# Patient Record
Sex: Male | Born: 1955 | Race: White | Hispanic: No | Marital: Married | Smoking: Never smoker
Health system: Southern US, Community
[De-identification: ages and names within clinical notes are randomized; demographics above are authoritative.]

## PROBLEM LIST (undated history)

## (undated) DIAGNOSIS — M1990 Unspecified osteoarthritis, unspecified site: Secondary | ICD-10-CM

## (undated) DIAGNOSIS — R519 Headache, unspecified: Secondary | ICD-10-CM

## (undated) DIAGNOSIS — R51 Headache: Secondary | ICD-10-CM

## (undated) DIAGNOSIS — Z87442 Personal history of urinary calculi: Secondary | ICD-10-CM

## (undated) DIAGNOSIS — E785 Hyperlipidemia, unspecified: Secondary | ICD-10-CM

## (undated) HISTORY — PX: HERNIA REPAIR: SHX51

## (undated) HISTORY — PX: OTHER SURGICAL HISTORY: SHX169

---

## 2007-11-01 ENCOUNTER — Encounter (INDEPENDENT_AMBULATORY_CARE_PROVIDER_SITE_OTHER): Payer: Self-pay | Admitting: *Deleted

## 2007-11-01 ENCOUNTER — Ambulatory Visit (HOSPITAL_COMMUNITY): Admission: RE | Admit: 2007-11-01 | Discharge: 2007-11-01 | Payer: Self-pay | Admitting: *Deleted

## 2008-07-21 ENCOUNTER — Emergency Department (HOSPITAL_COMMUNITY): Admission: EM | Admit: 2008-07-21 | Discharge: 2008-07-21 | Payer: Self-pay | Admitting: Emergency Medicine

## 2010-04-05 ENCOUNTER — Encounter
Admission: RE | Admit: 2010-04-05 | Discharge: 2010-04-05 | Payer: Self-pay | Source: Home / Self Care | Attending: Internal Medicine | Admitting: Internal Medicine

## 2010-07-17 LAB — COMPREHENSIVE METABOLIC PANEL
ALT: 31 U/L (ref 0–53)
Albumin: 3.8 g/dL (ref 3.5–5.2)
CO2: 28 mEq/L (ref 19–32)
Calcium: 8.8 mg/dL (ref 8.4–10.5)
GFR calc Af Amer: >60 mL/min (ref >60)
Glucose, Bld: 96 mg/dL (ref 70–99)
Potassium: 3.7 mEq/L (ref 3.5–5.1)
Total Bilirubin: 0.8 mg/dL (ref 0.3–1.2)
Total Protein: 6.1 g/dL (ref 6.0–8.3)

## 2010-07-17 LAB — URINALYSIS, ROUTINE W REFLEX MICROSCOPIC
Bilirubin Urine: NEGATIVE
Ketones, ur: 15 mg/dL — AB
Protein, ur: NEGATIVE mg/dL
Specific Gravity, Urine: 1.014 (ref 1.005–1.030)
pH: 6.5 (ref 5.0–8.0)

## 2010-07-17 LAB — DIFFERENTIAL
Basophils Relative: 0 % (ref 0–1)
Eosinophils Absolute: 0.1 10*3/uL (ref 0.0–0.7)
Eosinophils Relative: 1 % (ref 0–5)
Lymphs Abs: 1.2 10*3/uL (ref 0.7–4.0)
Monocytes Absolute: 0.6 10*3/uL (ref 0.1–1.0)
Monocytes Relative: 7 % (ref 3–12)

## 2010-07-17 LAB — CBC
MCHC: 35.1 g/dL (ref 30.0–36.0)
MCV: 95 fL (ref 78.0–100.0)
WBC: 9.1 10*3/uL (ref 4.0–10.5)

## 2010-07-17 LAB — URINE MICROSCOPIC-ADD ON

## 2010-07-17 LAB — LACTIC ACID, PLASMA: Lactic Acid, Venous: 1.5 mmol/L (ref 0.5–2.2)

## 2010-08-20 NOTE — Op Note (Signed)
NAME:  Ronald Daniels, Ronald Daniels NO.:  [id number]   MEDICAL RECORD NO.:  [id number]          PATIENT TYPE:  AMB   LOCATION:  ENDO                         FACILITY:  [facility]   PHYSICIAN:  Georgiana Spinner, M.D.    DATE OF BIRTH:  1956/03/26   DATE OF PROCEDURE:  11/01/2007  DATE OF DISCHARGE:                               OPERATIVE REPORT   PROCEDURE:  Colonoscopy, with polypectomy and biopsy.   INDICATIONS:  Colon polyps, colon cancer screening.   ANESTHESIA:  1. Fentanyl 100 mcg.  2. Versed 8 mg.   PROCEDURE:  With the patient mildly sedated in the left lateral  decubitus position, the Pentax videoscopic colonoscope was inserted in  the rectum after normal rectal exam and passed under direct vision.  With pressure applied, we reached the cecum, identified by the ileocecal  valve and appendiceal orifice, both of which were photographed.  In the  cecum was a linear polyp that was photographed and removed using snare  cautery technique, setting of 20/150 blended current.  The polyp was  suctioned into the endoscope and retrieved through a tissue trap.  From  this point, the colonoscope was slowly withdrawn, taking circumferential  views of the colonic mucosa, stopping in the ascending colon and  subsequently the descending colon, where two small polyps were seen, one  in each section.  Both were removed using hot biopsy forceps technique,  with the same setting of 20/150 blended current.  Both were retrieved  for pathology.  The endoscope was then further withdrawn all the way to  the rectum which appeared normal on direct and showed hemorrhoidal  tissue on retroflexed view.  The endoscope was straightened and  withdrawn.  The patient's vital signs and pulse oximeter remained  stable.  The patient tolerated the procedure well, without apparent  complications.   FINDINGS:  Polyp of cecum and descending colon and descending colon.   PLAN:  Await biopsy reports.  The patient  will call me for results and  follow up with me as an outpatient. Internal hemorrhoids were noted as  well.           ______________________________  Georgiana Spinner, M.D.     GMO/MEDQ  D:  11/01/2007  T:  11/01/2007  Job:  [id number]

## 2010-10-25 ENCOUNTER — Other Ambulatory Visit: Payer: Self-pay | Admitting: Otolaryngology

## 2010-11-09 ENCOUNTER — Ambulatory Visit
Admission: RE | Admit: 2010-11-09 | Discharge: 2010-11-09 | Disposition: A | Discharge status: Home / Self Care | Payer: Self-pay | Source: Ambulatory Visit | Attending: Otolaryngology | Admitting: Otolaryngology

## 2010-11-09 MED ORDER — GADOBENATE DIMEGLUMINE 529 MG/ML IV SOLN
15.0000 mL | Freq: Once | INTRAVENOUS | Status: AC | PRN
  Administered 2010-11-09: 15 mL via INTRAVENOUS

## 2010-11-14 ENCOUNTER — Other Ambulatory Visit (HOSPITAL_COMMUNITY): Payer: Private Health Insurance - Indemnity | Admitting: Radiology

## 2010-12-23 ENCOUNTER — Other Ambulatory Visit (HOSPITAL_COMMUNITY): Payer: Self-pay | Admitting: Otolaryngology

## 2010-12-23 ENCOUNTER — Ambulatory Visit (HOSPITAL_COMMUNITY): Payer: Managed Care, Other (non HMO) | Attending: Otolaryngology | Admitting: Radiology

## 2010-12-23 ENCOUNTER — Other Ambulatory Visit (HOSPITAL_COMMUNITY): Payer: Self-pay | Admitting: Radiology

## 2010-12-23 DIAGNOSIS — Q2111 Secundum atrial septal defect: Secondary | ICD-10-CM | POA: Insufficient documentation

## 2010-12-23 DIAGNOSIS — I379 Nonrheumatic pulmonary valve disorder, unspecified: Secondary | ICD-10-CM | POA: Insufficient documentation

## 2010-12-23 DIAGNOSIS — E785 Hyperlipidemia, unspecified: Secondary | ICD-10-CM | POA: Insufficient documentation

## 2010-12-23 DIAGNOSIS — Q211 Atrial septal defect: Secondary | ICD-10-CM

## 2010-12-23 DIAGNOSIS — Q2112 Patent foramen ovale: Secondary | ICD-10-CM

## 2010-12-23 DIAGNOSIS — I079 Rheumatic tricuspid valve disease, unspecified: Secondary | ICD-10-CM | POA: Insufficient documentation

## 2010-12-24 ENCOUNTER — Encounter (HOSPITAL_COMMUNITY): Payer: Self-pay | Admitting: Otolaryngology

## 2015-07-23 ENCOUNTER — Other Ambulatory Visit: Payer: Self-pay | Admitting: Internal Medicine

## 2015-07-23 DIAGNOSIS — R945 Abnormal results of liver function studies: Secondary | ICD-10-CM

## 2016-04-29 DIAGNOSIS — H5203 Hypermetropia, bilateral: Secondary | ICD-10-CM | POA: Diagnosis not present

## 2016-07-21 DIAGNOSIS — Z125 Encounter for screening for malignant neoplasm of prostate: Secondary | ICD-10-CM | POA: Diagnosis not present

## 2016-07-21 DIAGNOSIS — Z0000 Encounter for general adult medical examination without abnormal findings: Secondary | ICD-10-CM | POA: Diagnosis not present

## 2016-07-28 DIAGNOSIS — M25551 Pain in right hip: Secondary | ICD-10-CM | POA: Diagnosis not present

## 2016-07-28 DIAGNOSIS — Z0000 Encounter for general adult medical examination without abnormal findings: Secondary | ICD-10-CM | POA: Diagnosis not present

## 2016-07-28 DIAGNOSIS — E7800 Pure hypercholesterolemia, unspecified: Secondary | ICD-10-CM | POA: Diagnosis not present

## 2016-08-05 DIAGNOSIS — M150 Primary generalized (osteo)arthritis: Secondary | ICD-10-CM | POA: Diagnosis not present

## 2016-08-05 DIAGNOSIS — M25551 Pain in right hip: Secondary | ICD-10-CM | POA: Diagnosis not present

## 2016-08-05 DIAGNOSIS — M25561 Pain in right knee: Secondary | ICD-10-CM | POA: Diagnosis not present

## 2016-08-05 DIAGNOSIS — M545 Low back pain: Secondary | ICD-10-CM | POA: Diagnosis not present

## 2016-09-03 ENCOUNTER — Ambulatory Visit (INDEPENDENT_AMBULATORY_CARE_PROVIDER_SITE_OTHER): Payer: 59 | Admitting: Orthopaedic Surgery

## 2016-09-03 DIAGNOSIS — M25551 Pain in right hip: Secondary | ICD-10-CM | POA: Diagnosis not present

## 2016-09-03 DIAGNOSIS — M1611 Unilateral primary osteoarthritis, right hip: Secondary | ICD-10-CM | POA: Insufficient documentation

## 2016-09-03 NOTE — Progress Notes (Signed)
Office Visit Note   Patient: Ronald Daniels           Date of Birth: 03/19/1956           MRN: [id number] Visit Date: 09/03/2016              Requested by: No referring provider defined for this encounter. PCP: Ronald Gravel, MD   Assessment & Plan: Visit Diagnoses:  1. Pain of right hip joint   2. Unilateral primary osteoarthritis, right hip     Plan: Given the comminution of his x-ray findings and his clinical exam findings as well as his history a right hip replacement is recommended and warranted. I showed him a hip model and went over his x-rays in detail with him. We had a long and thorough discussion about hip replacement surgery including a detailed discussion of the risk and benefits of the surgery and what is intraoperative and postoperative course will involve. All questions were encouraged and answered. He does wish proceed with hip replacement surgery think this is definitely medically warranted at this point given the severity of his right hip arthritis. We would see him back in 2 weeks postoperative but no x-rays of be needed.  Follow-Up Instructions: Return for 2 weeks post-op.   Orders:  No orders of the defined types were placed in this encounter.  No orders of the defined types were placed in this encounter.     Procedures: No procedures performed   Clinical Data: No additional findings.   Subjective: No chief complaint on file. The patient is very pleasant 61 year old gentleman referred from Dr. Jani Daniels his primary care physician to evaluate severe right hip pain and arthritis. This is been slowly getting worse for about 5 years now. At this point he uses a cane when he walks around. He is tried and failed all forms conservative treatment. His pain is 10 out of 10. It is detrimentally affects his activities daily living, his quality of life, and his mobility. He denies any injury to the hip but the pain is definitely in the groin. He says his motion of the  hip is significantly limited as well.  HPI  Review of Systems He denies any headache, chest pain, shortness of breath, fever, chills, nausea, vomiting.  Objective: Vital Signs: There were no vitals taken for this visit.  Physical Exam He is alert and or a 3 and in no acute distress Ortho Exam He walks with a significant limp. He has almost essentially no internal or external rotation of the right hip and is severely painful. His leg lengths are surprisingly equal. Specialty Comments:  No specialty comments available.  Imaging: No results found. X-rays on canopy system of his pelvis independently reviewed by me show severe end-stage arthritis of his right hip. He actually has moderate arthritis of the left hip. The right hip has complete loss of the joint space. There severe sclerotic changes and significant para-articular osteophytes all around the right hip. His leg lengths do appear equal  PMFS History: Patient Active Problem List   Diagnosis Date Noted  . Unilateral primary osteoarthritis, right hip 09/03/2016  . Pain of right hip joint 09/03/2016   No past medical history on file.  No family history on file.  No past surgical history on file. Social History   Occupational History  . Not on file.   Social History Main Topics  . Smoking status: Not on file  . Smokeless tobacco: Not on file  .  Alcohol use Not on file  . Drug use: Unknown  . Sexual activity: Not on file

## 2016-09-26 ENCOUNTER — Other Ambulatory Visit (INDEPENDENT_AMBULATORY_CARE_PROVIDER_SITE_OTHER): Payer: Self-pay | Admitting: Physician Assistant

## 2016-09-29 ENCOUNTER — Other Ambulatory Visit (INDEPENDENT_AMBULATORY_CARE_PROVIDER_SITE_OTHER): Payer: Self-pay | Admitting: Orthopaedic Surgery

## 2016-10-01 ENCOUNTER — Encounter (HOSPITAL_COMMUNITY)
Admission: RE | Admit: 2016-10-01 | Discharge: 2016-10-01 | Disposition: A | Discharge status: Home / Self Care | Payer: 59 | Source: Ambulatory Visit | Attending: Orthopaedic Surgery | Admitting: Orthopaedic Surgery

## 2016-10-01 ENCOUNTER — Encounter (HOSPITAL_COMMUNITY): Payer: Self-pay

## 2016-10-01 ENCOUNTER — Other Ambulatory Visit (HOSPITAL_COMMUNITY): Payer: Self-pay | Admitting: *Deleted

## 2016-10-01 DIAGNOSIS — Z01812 Encounter for preprocedural laboratory examination: Secondary | ICD-10-CM | POA: Insufficient documentation

## 2016-10-01 DIAGNOSIS — M25551 Pain in right hip: Secondary | ICD-10-CM | POA: Diagnosis not present

## 2016-10-01 DIAGNOSIS — M1611 Unilateral primary osteoarthritis, right hip: Secondary | ICD-10-CM | POA: Diagnosis not present

## 2016-10-01 HISTORY — DX: Unspecified osteoarthritis, unspecified site: M19.90

## 2016-10-01 HISTORY — DX: Hyperlipidemia, unspecified: E78.5

## 2016-10-01 HISTORY — DX: Headache, unspecified: R51.9

## 2016-10-01 HISTORY — DX: Personal history of urinary calculi: Z87.442

## 2016-10-01 HISTORY — DX: Headache: R51

## 2016-10-01 LAB — CBC
HCT: 47.2 % (ref 39.0–52.0)
HEMOGLOBIN: 16.5 g/dL (ref 13.0–17.0)
MCH: 31.9 pg (ref 26.0–34.0)
MCHC: 35 g/dL (ref 30.0–36.0)
MCV: 91.1 fL (ref 78.0–100.0)
Platelets: 180 10*3/uL (ref 150–400)
RBC: 5.18 MIL/uL (ref 4.22–5.81)
RDW: 12.7 % (ref 11.5–15.5)
WBC: 6.6 10*3/uL (ref 4.0–10.5)

## 2016-10-01 LAB — SURGICAL PCR SCREEN
MRSA, PCR: NEGATIVE
Staphylococcus aureus: POSITIVE — AB

## 2016-10-01 NOTE — Pre-Procedure Instructions (Signed)
Ronald Daniels  10/01/2016      [facility], [place] [address] Phone: [phone] Fax: [phone]    Your procedure is scheduled on October 07, 2016 Tuesday.   Report to [facility] Admitting at 01:15 P.M.   Call this number if you have problems the morning of surgery:  [phone]   Remember:  Do not eat food or drink liquids after midnight.  Take these medicines the morning of surgery with A SIP OF WATER: Flexeril (as needed), Gabapentin, and Afrin (as needed).  STOP ASPIRIN,ANTIINFLAMATORIES (IBUPROFEN,ALEVE,MOTRIN,ADVIL,GOODY'S POWDERS),HERBAL SUPPLEMENTS,FISH OIL,AND VITAMINS 5-7 DAYS PRIOR TO SURGERY    Do not wear jewelry, make-up or nail polish.  Do not wear lotions, powders, or perfumes, or deoderant.  Do not shave 48 hours prior to surgery.  Men may shave face and neck.  Do not bring valuables to the hospital.  [facility] is not responsible for any belongings or valuables.  Contacts, dentures or bridgework may not be worn into surgery.  Leave your suitcase in the car.  After surgery it may be brought to your room.  For patients admitted to the hospital, discharge time will be determined by your treatment team.  Patients discharged the day of surgery will not be allowed to drive home.   Special Instructions: [place] - Preparing for Surgery  Before surgery, you can play an important role.  Because skin is not sterile, your skin needs to be as free of germs as possible.  You can reduce the number of germs on you skin by washing with CHG (chlorahexidine gluconate) soap before surgery.  CHG is an antiseptic cleaner which kills germs and bonds with the skin to continue killing germs even after washing.  Please DO NOT use if you have an allergy to CHG or antibacterial soaps.  If your skin becomes reddened/irritated stop using the CHG and inform your nurse when you  arrive at Short Stay.  Do not shave (including legs and underarms) for at least 48 hours prior to the first CHG shower.  You may shave your face.  Please follow these instructions carefully:   1.  Shower with CHG Soap the night before surgery and the   morning of Surgery.  2.  If you choose to wash your hair, wash your hair first as usual with your normal shampoo.  3.  After you shampoo, rinse your hair and body thoroughly to remove the  Shampoo.  4.  Use CHG as you would any other liquid soap.  You can apply chg directly  to the skin and wash gently with scrungie or a clean washcloth.  5.  Apply the CHG Soap to your body ONLY FROM THE NECK DOWN.   Do not use on open wounds or open sores.  Avoid contact with your eyes,  ears, mouth and genitals (private parts).  Wash genitals (private parts) with your normal soap.  6.  Wash thoroughly, paying special attention to the area where your surgery will be performed.  7.  Thoroughly rinse your body with warm water from the neck down.  8.  DO NOT shower/wash with your normal soap after using and rinsing o  the CHG Soap.  9.  Pat yourself dry with a clean towel.            10.  Wear clean pajamas.  11.  Place clean sheets on your bed the night of your first shower and do not sleep with pets.  Day of Surgery  Do not apply any lotions/deodorants the morning of surgery.  Please wear clean clothes to the hospital/surgery center.   Please read over the following fact sheets that you were given. MRSA Information and Surgical Site Infection Prevention

## 2016-10-01 NOTE — Progress Notes (Signed)
Mupirocin Ointment called into [facility] on [address] for positive PCR of Staph. Left message on pt's voicemail informing him of results and need to pick up Rx and to start using it tonight.

## 2016-10-01 NOTE — Progress Notes (Signed)
Denies seeing a cardiologist  No recent chest pain or discorfort

## 2016-10-06 MED ORDER — SODIUM CHLORIDE 0.9 % IV SOLN
1000.0000 mg | INTRAVENOUS | Status: AC
  Administered 2016-10-07: 1000 mg via INTRAVENOUS
  Filled 2016-10-06: qty 1100

## 2016-10-06 MED ORDER — CEFAZOLIN SODIUM-DEXTROSE 2-4 GM/100ML-% IV SOLN
2.0000 g | INTRAVENOUS | Status: AC
  Administered 2016-10-07: 2 g via INTRAVENOUS
  Filled 2016-10-06: qty 100

## 2016-10-07 ENCOUNTER — Encounter (HOSPITAL_COMMUNITY): Payer: Self-pay

## 2016-10-07 ENCOUNTER — Inpatient Hospital Stay (HOSPITAL_COMMUNITY): Payer: 59

## 2016-10-07 ENCOUNTER — Encounter (HOSPITAL_COMMUNITY)
Admission: RE | Disposition: A | Discharge status: Home / Self Care | Payer: Self-pay | Source: Ambulatory Visit | Attending: Orthopaedic Surgery

## 2016-10-07 ENCOUNTER — Inpatient Hospital Stay (HOSPITAL_COMMUNITY): Payer: 59 | Admitting: Certified Registered Nurse Anesthetist

## 2016-10-07 ENCOUNTER — Inpatient Hospital Stay (HOSPITAL_COMMUNITY)
Admission: RE | Admit: 2016-10-07 | Discharge: 2016-10-09 | DRG: 470 | Disposition: A | Discharge status: Home / Self Care | Payer: 59 | Source: Ambulatory Visit | Attending: Orthopaedic Surgery | Admitting: Orthopaedic Surgery

## 2016-10-07 DIAGNOSIS — E785 Hyperlipidemia, unspecified: Secondary | ICD-10-CM | POA: Diagnosis present

## 2016-10-07 DIAGNOSIS — Z87442 Personal history of urinary calculi: Secondary | ICD-10-CM

## 2016-10-07 DIAGNOSIS — Z79899 Other long term (current) drug therapy: Secondary | ICD-10-CM

## 2016-10-07 DIAGNOSIS — M1611 Unilateral primary osteoarthritis, right hip: Secondary | ICD-10-CM | POA: Diagnosis not present

## 2016-10-07 DIAGNOSIS — Z96641 Presence of right artificial hip joint: Secondary | ICD-10-CM

## 2016-10-07 DIAGNOSIS — Z419 Encounter for procedure for purposes other than remedying health state, unspecified: Secondary | ICD-10-CM

## 2016-10-07 DIAGNOSIS — Z471 Aftercare following joint replacement surgery: Secondary | ICD-10-CM | POA: Diagnosis not present

## 2016-10-07 DIAGNOSIS — R269 Unspecified abnormalities of gait and mobility: Secondary | ICD-10-CM | POA: Diagnosis not present

## 2016-10-07 HISTORY — PX: TOTAL HIP ARTHROPLASTY: SHX124

## 2016-10-07 SURGERY — ARTHROPLASTY, HIP, TOTAL, ANTERIOR APPROACH
Anesthesia: General | Site: Hip | Laterality: Right

## 2016-10-07 MED ORDER — METOCLOPRAMIDE HCL 5 MG PO TABS
5.0000 mg | ORAL_TABLET | Freq: Three times a day (TID) | ORAL | Status: DC | PRN
Start: 2016-10-07 — End: 2016-10-09

## 2016-10-07 MED ORDER — PSYLLIUM 95 % PO PACK
1.0000 | PACK | Freq: Every day | ORAL | Status: DC
  Administered 2016-10-08: 1 via ORAL
  Filled 2016-10-07 (×2): qty 1

## 2016-10-07 MED ORDER — ACETAMINOPHEN 500 MG PO TABS
ORAL_TABLET | ORAL | Status: AC
  Administered 2016-10-07: 1000 mg via ORAL
  Filled 2016-10-07: qty 2

## 2016-10-07 MED ORDER — ROCURONIUM BROMIDE 100 MG/10ML IV SOLN
INTRAVENOUS | Status: DC | PRN
  Administered 2016-10-07: 50 mg via INTRAVENOUS

## 2016-10-07 MED ORDER — LACTATED RINGERS IV SOLN
INTRAVENOUS | Status: DC | PRN
  Administered 2016-10-07 (×2): via INTRAVENOUS

## 2016-10-07 MED ORDER — DEXAMETHASONE SODIUM PHOSPHATE 10 MG/ML IJ SOLN
INTRAMUSCULAR | Status: AC
  Filled 2016-10-07: qty 1

## 2016-10-07 MED ORDER — ACETAMINOPHEN 500 MG PO TABS
1000.0000 mg | ORAL_TABLET | Freq: Once | ORAL | Status: AC
  Administered 2016-10-07: 1000 mg via ORAL
  Filled 2016-10-07: qty 2

## 2016-10-07 MED ORDER — DIPHENHYDRAMINE HCL 12.5 MG/5ML PO ELIX: 12.5000 mg | ORAL_SOLUTION | ORAL | Status: DC | PRN

## 2016-10-07 MED ORDER — KETOROLAC TROMETHAMINE 15 MG/ML IJ SOLN
7.5000 mg | Freq: Four times a day (QID) | INTRAMUSCULAR | Status: AC
  Administered 2016-10-07 – 2016-10-08 (×4): 7.5 mg via INTRAVENOUS
  Filled 2016-10-07 (×5): qty 1

## 2016-10-07 MED ORDER — FENTANYL CITRATE (PF) 100 MCG/2ML IJ SOLN
INTRAMUSCULAR | Status: DC | PRN
  Administered 2016-10-07: 100 ug via INTRAVENOUS
  Administered 2016-10-07 (×3): 50 ug via INTRAVENOUS

## 2016-10-07 MED ORDER — ACETAMINOPHEN 650 MG RE SUPP: 650.0000 mg | Freq: Four times a day (QID) | RECTAL | Status: DC | PRN

## 2016-10-07 MED ORDER — SODIUM CHLORIDE 0.9 % IV SOLN: INTRAVENOUS | Status: DC

## 2016-10-07 MED ORDER — MIDAZOLAM HCL 5 MG/5ML IJ SOLN
INTRAMUSCULAR | Status: DC | PRN
  Administered 2016-10-07: 2 mg via INTRAVENOUS

## 2016-10-07 MED ORDER — MENTHOL 3 MG MT LOZG: 1.0000 | LOZENGE | OROMUCOSAL | Status: DC | PRN

## 2016-10-07 MED ORDER — METHOCARBAMOL 1000 MG/10ML IJ SOLN
500.0000 mg | Freq: Four times a day (QID) | INTRAVENOUS | Status: DC | PRN
  Filled 2016-10-07: qty 5

## 2016-10-07 MED ORDER — ONDANSETRON HCL 4 MG/2ML IJ SOLN
INTRAMUSCULAR | Status: DC | PRN
  Administered 2016-10-07: 4 mg via INTRAVENOUS

## 2016-10-07 MED ORDER — POLYETHYLENE GLYCOL 3350 17 G PO PACK
17.0000 g | PACK | Freq: Every day | ORAL | Status: DC | PRN
  Filled 2016-10-07: qty 1

## 2016-10-07 MED ORDER — GABAPENTIN 300 MG PO CAPS
300.0000 mg | ORAL_CAPSULE | Freq: Two times a day (BID) | ORAL | Status: DC
  Administered 2016-10-07 – 2016-10-09 (×4): 300 mg via ORAL
  Filled 2016-10-07 (×4): qty 1

## 2016-10-07 MED ORDER — FENTANYL CITRATE (PF) 250 MCG/5ML IJ SOLN
INTRAMUSCULAR | Status: AC
  Filled 2016-10-07: qty 5

## 2016-10-07 MED ORDER — OXYCODONE HCL 5 MG PO TABS
5.0000 mg | ORAL_TABLET | ORAL | Status: DC | PRN
  Administered 2016-10-07 – 2016-10-09 (×8): 10 mg via ORAL
  Filled 2016-10-07 (×8): qty 2

## 2016-10-07 MED ORDER — SIMVASTATIN 20 MG PO TABS
20.0000 mg | ORAL_TABLET | Freq: Every day | ORAL | Status: DC
  Administered 2016-10-07 – 2016-10-08 (×2): 20 mg via ORAL
  Filled 2016-10-07 (×2): qty 1

## 2016-10-07 MED ORDER — ACETAMINOPHEN 325 MG PO TABS
650.0000 mg | ORAL_TABLET | Freq: Four times a day (QID) | ORAL | Status: DC | PRN
  Administered 2016-10-07: 650 mg via ORAL
  Filled 2016-10-07: qty 2

## 2016-10-07 MED ORDER — ALUM & MAG HYDROXIDE-SIMETH 200-200-20 MG/5ML PO SUSP: 30.0000 mL | ORAL | Status: DC | PRN

## 2016-10-07 MED ORDER — SUGAMMADEX SODIUM 200 MG/2ML IV SOLN
INTRAVENOUS | Status: DC | PRN
  Administered 2016-10-07: 200 mg via INTRAVENOUS

## 2016-10-07 MED ORDER — ASPIRIN 81 MG PO CHEW
81.0000 mg | CHEWABLE_TABLET | Freq: Two times a day (BID) | ORAL | Status: DC
  Administered 2016-10-07 – 2016-10-09 (×4): 81 mg via ORAL
  Filled 2016-10-07 (×4): qty 1

## 2016-10-07 MED ORDER — HYDROMORPHONE HCL 1 MG/ML IJ SOLN
INTRAMUSCULAR | Status: AC
Start: 2016-10-07 — End: 2016-10-07
  Filled 2016-10-07: qty 0.5

## 2016-10-07 MED ORDER — HYDROMORPHONE HCL 1 MG/ML IJ SOLN
1.0000 mg | INTRAMUSCULAR | Status: DC | PRN
  Administered 2016-10-07: 1 mg via INTRAVENOUS
  Filled 2016-10-07: qty 1

## 2016-10-07 MED ORDER — ZOLPIDEM TARTRATE 5 MG PO TABS
5.0000 mg | ORAL_TABLET | Freq: Every evening | ORAL | Status: DC | PRN
  Administered 2016-10-07 – 2016-10-08 (×2): 5 mg via ORAL
  Filled 2016-10-07 (×2): qty 1

## 2016-10-07 MED ORDER — CEFAZOLIN SODIUM-DEXTROSE 1-4 GM/50ML-% IV SOLN
1.0000 g | Freq: Four times a day (QID) | INTRAVENOUS | Status: AC
  Administered 2016-10-07 – 2016-10-08 (×2): 1 g via INTRAVENOUS
  Filled 2016-10-07 (×3): qty 50

## 2016-10-07 MED ORDER — OXYCODONE HCL 5 MG PO TABS: 5.0000 mg | ORAL_TABLET | Freq: Once | ORAL | Status: DC | PRN

## 2016-10-07 MED ORDER — DOCUSATE SODIUM 100 MG PO CAPS
100.0000 mg | ORAL_CAPSULE | Freq: Two times a day (BID) | ORAL | Status: DC
  Administered 2016-10-07 – 2016-10-09 (×4): 100 mg via ORAL
  Filled 2016-10-07 (×4): qty 1

## 2016-10-07 MED ORDER — ONDANSETRON HCL 4 MG PO TABS: 4.0000 mg | ORAL_TABLET | Freq: Four times a day (QID) | ORAL | Status: DC | PRN

## 2016-10-07 MED ORDER — LIDOCAINE HCL (CARDIAC) 20 MG/ML IV SOLN
INTRAVENOUS | Status: DC | PRN
  Administered 2016-10-07: 60 mg via INTRAVENOUS

## 2016-10-07 MED ORDER — 0.9 % SODIUM CHLORIDE (POUR BTL) OPTIME
TOPICAL | Status: DC | PRN
  Administered 2016-10-07: 1000 mL

## 2016-10-07 MED ORDER — MIDAZOLAM HCL 2 MG/2ML IJ SOLN
INTRAMUSCULAR | Status: AC
Start: 2016-10-07 — End: ?
  Filled 2016-10-07: qty 2

## 2016-10-07 MED ORDER — ONDANSETRON HCL 4 MG/2ML IJ SOLN: 4.0000 mg | Freq: Four times a day (QID) | INTRAMUSCULAR | Status: DC | PRN

## 2016-10-07 MED ORDER — HYDROMORPHONE HCL 1 MG/ML IJ SOLN
INTRAMUSCULAR | Status: AC
  Administered 2016-10-07: 0.5 mg via INTRAVENOUS
  Filled 2016-10-07: qty 0.5

## 2016-10-07 MED ORDER — CHLORHEXIDINE GLUCONATE 4 % EX LIQD: 60.0000 mL | Freq: Once | CUTANEOUS | Status: DC

## 2016-10-07 MED ORDER — PHENOL 1.4 % MT LIQD: 1.0000 | OROMUCOSAL | Status: DC | PRN

## 2016-10-07 MED ORDER — SUGAMMADEX SODIUM 200 MG/2ML IV SOLN
INTRAVENOUS | Status: AC
  Filled 2016-10-07: qty 2

## 2016-10-07 MED ORDER — METHOCARBAMOL 500 MG PO TABS
500.0000 mg | ORAL_TABLET | Freq: Four times a day (QID) | ORAL | Status: DC | PRN
  Administered 2016-10-07 – 2016-10-09 (×3): 500 mg via ORAL
  Filled 2016-10-07 (×4): qty 1

## 2016-10-07 MED ORDER — HYDROMORPHONE HCL 1 MG/ML IJ SOLN
0.2500 mg | INTRAMUSCULAR | Status: DC | PRN
Start: 2016-10-07 — End: 2016-10-07
  Administered 2016-10-07 (×2): 0.5 mg via INTRAVENOUS

## 2016-10-07 MED ORDER — PROPOFOL 10 MG/ML IV BOLUS
INTRAVENOUS | Status: DC | PRN
  Administered 2016-10-07: 170 mg via INTRAVENOUS

## 2016-10-07 MED ORDER — SODIUM CHLORIDE 0.9 % IR SOLN
Status: DC | PRN
  Administered 2016-10-07: 1000 mL

## 2016-10-07 MED ORDER — METOCLOPRAMIDE HCL 5 MG/ML IJ SOLN: 5.0000 mg | Freq: Three times a day (TID) | INTRAMUSCULAR | Status: DC | PRN

## 2016-10-07 MED ORDER — OXYCODONE HCL 5 MG/5ML PO SOLN: 5.0000 mg | Freq: Once | ORAL | Status: DC | PRN

## 2016-10-07 SURGICAL SUPPLY — 49 items
BENZOIN TINCTURE PRP APPL 2/3 (GAUZE/BANDAGES/DRESSINGS) ×2 IMPLANT
BLADE CLIPPER SURG (BLADE) IMPLANT
BLADE SAW SGTL 18X1.27X75 (BLADE) ×2 IMPLANT
CAPT HIP TOTAL 2 ×2 IMPLANT
CELLS DAT CNTRL 66122 CELL SVR (MISCELLANEOUS) ×1 IMPLANT
COVER SURGICAL LIGHT HANDLE (MISCELLANEOUS) ×2 IMPLANT
DRAPE C-ARM 42X72 X-RAY (DRAPES) ×2 IMPLANT
DRAPE STERI IOBAN 125X83 (DRAPES) ×2 IMPLANT
DRAPE U-SHAPE 47X51 STRL (DRAPES) ×6 IMPLANT
DRSG AQUACEL AG ADV 3.5X10 (GAUZE/BANDAGES/DRESSINGS) ×2 IMPLANT
DURAPREP 26ML APPLICATOR (WOUND CARE) ×2 IMPLANT
ELECT BLADE 4.0 EZ CLEAN MEGAD (MISCELLANEOUS) ×2
ELECT BLADE 6.5 EXT (BLADE) IMPLANT
ELECT REM PT RETURN 9FT ADLT (ELECTROSURGICAL) ×2
ELECTRODE BLDE 4.0 EZ CLN MEGD (MISCELLANEOUS) ×1 IMPLANT
ELECTRODE REM PT RTRN 9FT ADLT (ELECTROSURGICAL) ×1 IMPLANT
FACESHIELD WRAPAROUND (MASK) ×4 IMPLANT
GLOVE BIOGEL PI IND STRL 8 (GLOVE) ×2 IMPLANT
GLOVE BIOGEL PI INDICATOR 8 (GLOVE) ×2
GLOVE ECLIPSE 8.0 STRL XLNG CF (GLOVE) ×2 IMPLANT
GLOVE ORTHO TXT STRL SZ7.5 (GLOVE) ×4 IMPLANT
GOWN STRL REUS W/ TWL LRG LVL3 (GOWN DISPOSABLE) ×2 IMPLANT
GOWN STRL REUS W/ TWL XL LVL3 (GOWN DISPOSABLE) ×2 IMPLANT
GOWN STRL REUS W/TWL LRG LVL3 (GOWN DISPOSABLE) ×2
GOWN STRL REUS W/TWL XL LVL3 (GOWN DISPOSABLE) ×2
HANDPIECE INTERPULSE COAX TIP (DISPOSABLE) ×1
KIT BASIN OR (CUSTOM PROCEDURE TRAY) ×2 IMPLANT
KIT ROOM TURNOVER OR (KITS) ×2 IMPLANT
MANIFOLD NEPTUNE II (INSTRUMENTS) ×2 IMPLANT
NS IRRIG 1000ML POUR BTL (IV SOLUTION) ×2 IMPLANT
PACK TOTAL JOINT (CUSTOM PROCEDURE TRAY) ×2 IMPLANT
PAD ARMBOARD 7.5X6 YLW CONV (MISCELLANEOUS) ×2 IMPLANT
RTRCTR WOUND ALEXIS 18CM MED (MISCELLANEOUS) ×2
SET HNDPC FAN SPRY TIP SCT (DISPOSABLE) ×1 IMPLANT
STAPLER VISISTAT 35W (STAPLE) IMPLANT
STRIP CLOSURE SKIN 1/2X4 (GAUZE/BANDAGES/DRESSINGS) ×2 IMPLANT
SUT ETHIBOND NAB CT1 #1 30IN (SUTURE) ×2 IMPLANT
SUT MNCRL AB 4-0 PS2 18 (SUTURE) ×2 IMPLANT
SUT VIC AB 0 CT1 27 (SUTURE) ×2
SUT VIC AB 0 CT1 27XBRD ANBCTR (SUTURE) ×2 IMPLANT
SUT VIC AB 1 CT1 27 (SUTURE) ×2
SUT VIC AB 1 CT1 27XBRD ANBCTR (SUTURE) ×2 IMPLANT
SUT VIC AB 2-0 CT1 27 (SUTURE) ×1
SUT VIC AB 2-0 CT1 TAPERPNT 27 (SUTURE) ×1 IMPLANT
TOWEL OR 17X24 6PK STRL BLUE (TOWEL DISPOSABLE) ×2 IMPLANT
TOWEL OR 17X26 10 PK STRL BLUE (TOWEL DISPOSABLE) ×2 IMPLANT
TRAY CATH 16FR W/PLASTIC CATH (SET/KITS/TRAYS/PACK) IMPLANT
TRAY FOLEY W/METER SILVER 16FR (SET/KITS/TRAYS/PACK) IMPLANT
WATER STERILE IRR 1000ML POUR (IV SOLUTION) ×4 IMPLANT

## 2016-10-07 NOTE — H&P (Signed)
TOTAL HIP ADMISSION H&P  Patient is admitted for right total hip arthroplasty.  Subjective:  Chief Complaint: right hip pain  HPI: Ronald Daniels, 61 y.o. male, has a history of pain and functional disability in the right hip(s) due to arthritis and patient has failed non-surgical conservative treatments for greater than 12 weeks to include NSAID's and/or analgesics, corticosteriod injections, use of assistive devices, weight reduction as appropriate and activity modification.  Onset of symptoms was gradual starting 5 years ago with gradually worsening course since that time.The patient noted no past surgery on the right hip(s).  Patient currently rates pain in the right hip at 10 out of 10 with activity. Patient has night pain, worsening of pain with activity and weight bearing, pain that interfers with activities of daily living and pain with passive range of motion. Patient has evidence of subchondral sclerosis, periarticular osteophytes and joint space narrowing by imaging studies. This condition presents safety issues increasing the risk of falls.  There is no current active infection.  Patient Active Problem List   Diagnosis Date Noted  . Unilateral primary osteoarthritis, right hip 09/03/2016  . Pain of right hip joint 09/03/2016   Past Medical History:  Diagnosis Date  . Arthritis   . Headache    Sinus headache  . History of kidney stones   . Hyperlipidemia     Past Surgical History:  Procedure Laterality Date  . HERNIA REPAIR    . torn cartialge Left     Prescriptions Prior to Admission  Medication Sig Dispense Refill Last Dose  . cyclobenzaprine (FLEXERIL) 10 MG tablet Take 10 mg by mouth 2 (two) times daily as needed. For back pain/spasms.  0   . Dextromethorphan-Guaifenesin 10-200 MG CAPS Take 1-2 tablets by mouth 3 (three) times daily as needed (for sinus/cold or flu).     . fexofenadine (ALLEGRA) 180 MG tablet Take 180 mg by mouth at bedtime.     . gabapentin  (NEURONTIN) 300 MG capsule Take 300 mg by mouth 2 (two) times daily.  0   . ibuprofen (ADVIL,MOTRIN) 200 MG tablet Take 800 mg by mouth every 8 (eight) hours as needed (for pain (back & legs)).     Marland Kitchen oxymetazoline (AFRIN) 0.05 % nasal spray Place 1 spray into both nostrils 3 (three) times daily as needed for congestion.     . Probiotic Product (PROBIOTIC PO) Take 2 capsules by mouth at bedtime.     . psyllium (REGULOID) 0.52 g capsule Take 2.08 g by mouth at bedtime.     . simvastatin (ZOCOR) 20 MG tablet Take 20 mg by mouth at bedtime.  0    Allergies  Allergen Reactions  . No Known Allergies     Social History  Substance Use Topics  . Smoking status: Never Smoker  . Smokeless tobacco: Never Used  . Alcohol use Yes     Comment: rarely    No family history on file.   Review of Systems  Musculoskeletal: Positive for joint pain.  All other systems reviewed and are negative.   Objective:  Physical Exam  Constitutional: He is oriented to person, place, and time. He appears well-developed and well-nourished.  HENT:  Head: Normocephalic and atraumatic.  Eyes: EOM are normal. Pupils are equal, round, and reactive to light.  Neck: Normal range of motion. Neck supple.  Cardiovascular: Normal rate and regular rhythm.   Respiratory: Effort normal and breath sounds normal.  GI: Soft. Bowel sounds are normal.  Musculoskeletal:  Right hip: He exhibits decreased range of motion, decreased strength, tenderness and bony tenderness.  Neurological: He is alert and oriented to person, place, and time.  Skin: Skin is warm and dry.  Psychiatric: He has a normal mood and affect.    Vital signs in last 24 hours: Temp:  [97.6 F (36.4 C)] 97.6 F (36.4 C) (07/03 1326) Pulse Rate:  [73] 73 (07/03 1326) Resp:  [20] 20 (07/03 1326) BP: (156)/(102) 156/102 (07/03 1326) SpO2:  [97 %] 97 % (07/03 1326) Weight:  [205 lb (93 kg)] 205 lb (93 kg) (07/03 1326)  Labs:   Estimated body mass  index is 29.41 kg/m as calculated from the following:   Height as of 10/01/16: 5\' 10"  (1.778 m).   Weight as of this encounter: 205 lb (93 kg).   Imaging Review Plain radiographs demonstrate severe degenerative joint disease of the right hip(s). The bone quality appears to be excellent for age and reported activity level.  Assessment/Plan:  End stage arthritis, right hip(s)  The patient history, physical examination, clinical judgement of the provider and imaging studies are consistent with end stage degenerative joint disease of the right hip(s) and total hip arthroplasty is deemed medically necessary. The treatment options including medical management, injection therapy, arthroscopy and arthroplasty were discussed at length. The risks and benefits of total hip arthroplasty were presented and reviewed. The risks due to aseptic loosening, infection, stiffness, dislocation/subluxation,  thromboembolic complications and other imponderables were discussed.  The patient acknowledged the explanation, agreed to proceed with the plan and consent was signed. Patient is being admitted for inpatient treatment for surgery, pain control, PT, OT, prophylactic antibiotics, VTE prophylaxis, progressive ambulation and ADL's and discharge planning.The patient is planning to be discharged home with home health services

## 2016-10-07 NOTE — Brief Op Note (Signed)
10/07/2016  5:27 PM  PATIENT:  Ronald Daniels  61 y.o. male  PRE-OPERATIVE DIAGNOSIS:  severe osteoarthritis right hip  POST-OPERATIVE DIAGNOSIS:  severe osteoarthritis right hip  PROCEDURE:  Procedure(s): RIGHT TOTAL HIP ARTHROPLASTY ANTERIOR APPROACH (Right)  SURGEON:  Surgeon(s) and Role:    Mcarthur Rossetti, MD - Primary  PHYSICIAN ASSISTANT: Benita Stabile, PA-C  ANESTHESIA:   general  EBL:  Total I/O In: 1000 [I.V.:1000] Out: 250 [Blood:250]  COUNTS:  YES  DICTATION: .Other Dictation: Dictation Number [id number]  PLAN OF CARE: Admit to inpatient   PATIENT DISPOSITION:  PACU - hemodynamically stable.   Delay start of Pharmacological VTE agent (>24hrs) due to surgical blood loss or risk of bleeding: no

## 2016-10-07 NOTE — Anesthesia Preprocedure Evaluation (Signed)
Anesthesia Evaluation  Patient identified by MRN, date of birth, ID band Patient awake    Reviewed: Allergy & Precautions, NPO status , Patient's Chart, lab work & pertinent test results  Airway Mallampati: II  TM Distance: >3 FB Neck ROM: Full    Dental  (+) Teeth Intact   Pulmonary neg pulmonary ROS,    breath sounds clear to auscultation       Cardiovascular negative cardio ROS   Rhythm:Regular     Neuro/Psych  Headaches, negative psych ROS   GI/Hepatic negative GI ROS, Neg liver ROS,   Endo/Other  negative endocrine ROS  Renal/GU negative Renal ROS     Musculoskeletal  (+) Arthritis ,   Abdominal   Peds  Hematology negative hematology ROS (+)   Anesthesia Other Findings   Reproductive/Obstetrics negative OB ROS                             Anesthesia Physical Anesthesia Plan  ASA: II  Anesthesia Plan: General   Post-op Pain Management:    Induction: Intravenous  PONV Risk Score and Plan: 2 and Ondansetron and Dexamethasone  Airway Management Planned: Oral ETT  Additional Equipment: None  Intra-op Plan:   Post-operative Plan: Extubation in OR  Informed Consent: I have reviewed the patients History and Physical, chart, labs and discussed the procedure including the risks, benefits and alternatives for the proposed anesthesia with the patient or authorized representative who has indicated his/her understanding and acceptance.   Dental advisory given  Plan Discussed with: CRNA and Surgeon  Anesthesia Plan Comments: (Refused spinal anesthesia)        Anesthesia Quick Evaluation

## 2016-10-07 NOTE — Progress Notes (Signed)
Pt arrived from PACU to room 5N21. Pt c/o pain to right hip. Right hip dressing in place, clean, dry and intact. Pain medication to be administered.

## 2016-10-07 NOTE — Transfer of Care (Signed)
Immediate Anesthesia Transfer of Care Note  Patient: Ronald Daniels  Procedure(s) Performed: Procedure(s): RIGHT TOTAL HIP ARTHROPLASTY ANTERIOR APPROACH (Right)  Patient Location: PACU  Anesthesia Type:General  Level of Consciousness: awake, alert , oriented and patient cooperative  Airway & Oxygen Therapy: Patient Spontanous Breathing and Patient connected to nasal cannula oxygen  Post-op Assessment: Report given to RN and Post -op Vital signs reviewed and stable  Post vital signs: Reviewed and stable  Last Vitals:  Vitals:   10/07/16 1326 10/07/16 1740  BP: (!) 156/102   Pulse: 73   Resp: 20   Temp: 36.4 C (P) 36.4 C    Last Pain:  Vitals:   10/07/16 1740  PainSc: (P) 5       Patients Stated Pain Goal: 1 (41/36/43 8377)  Complications: No apparent anesthesia complications

## 2016-10-07 NOTE — Anesthesia Procedure Notes (Signed)
Procedure Name: Intubation Date/Time: 10/07/2016 3:43 PM Performed by: Myna Bright Pre-anesthesia Checklist: Patient identified, Emergency Drugs available, Suction available and Patient being monitored Patient Re-evaluated:Patient Re-evaluated prior to inductionOxygen Delivery Method: Circle system utilized Preoxygenation: Pre-oxygenation with 100% oxygen Intubation Type: IV induction Ventilation: Mask ventilation without difficulty Laryngoscope Size: Mac and 4 Grade View: Grade I Tube type: Oral Tube size: 7.5 mm Number of attempts: 1 Airway Equipment and Method: Stylet Placement Confirmation: ETT inserted through vocal cords under direct vision,  positive ETCO2 and breath sounds checked- equal and bilateral Secured at: 22 cm Tube secured with: Tape Dental Injury: Teeth and Oropharynx as per pre-operative assessment

## 2016-10-08 LAB — BASIC METABOLIC PANEL
Anion gap: 8 (ref 5–15)
BUN: 17 mg/dL (ref 6–20)
CHLORIDE: 104 mmol/L (ref 101–111)
CO2: 25 mmol/L (ref 22–32)
Calcium: 8.7 mg/dL — ABNORMAL LOW (ref 8.9–10.3)
Creatinine, Ser: 1.1 mg/dL (ref 0.61–1.24)
GFR calc Af Amer: >60 mL/min (ref >60)
GFR calc non Af Amer: >60 mL/min (ref >60)
GLUCOSE: 172 mg/dL — AB (ref 65–99)
POTASSIUM: 4.3 mmol/L (ref 3.5–5.1)
Sodium: 137 mmol/L (ref 135–145)

## 2016-10-08 LAB — CBC
HEMATOCRIT: 42.5 % (ref 39.0–52.0)
HEMOGLOBIN: 14.3 g/dL (ref 13.0–17.0)
MCH: 30.9 pg (ref 26.0–34.0)
MCHC: 33.6 g/dL (ref 30.0–36.0)
MCV: 91.8 fL (ref 78.0–100.0)
Platelets: 170 10*3/uL (ref 150–400)
RBC: 4.63 MIL/uL (ref 4.22–5.81)
RDW: 12.8 % (ref 11.5–15.5)
WBC: 9.9 10*3/uL (ref 4.0–10.5)

## 2016-10-08 NOTE — Evaluation (Signed)
Occupational Therapy Evaluation and Discharge Patient Details Name: Ronald Daniels MRN: [id number] DOB: 09-Sep-1955 Today's Date: 10/08/2016    History of Present Illness Patient is a 61 y/o male s/p elective R THA on 09/07/16. No pertinent past medical history.    Clinical Impression   PTA Pt independent in ADL and mobility. Pt presented very pleasant and willing to work with OT. Pt's wife present throughout session. Pt able to demonstrate LB dressing, grooming and simulated toilet transfer this session with no physical assist needed. Pt and wife educated on compensatory strategies, precautions, and safety strategies. Pt and wife verbalizing understanding. All education complete and Pt/wife with no questions at the end of the sessin. OT to sign off at this time. Thank you for this referral.     Follow Up Recommendations  No OT follow up;Supervision - Intermittent    Equipment Recommendations  None recommended by OT    Recommendations for Other Services       Precautions / Restrictions Precautions Precautions: Anterior Hip Restrictions Weight Bearing Restrictions: Yes RLE Weight Bearing: Weight bearing as tolerated      Mobility Bed Mobility Overal bed mobility: Modified Independent             General bed mobility comments: increased time due to sore from walking with PT  Transfers Overall transfer level: Needs assistance Equipment used: Rolling walker (2 wheeled) Transfers: Sit to/from Stand Sit to Stand: Min guard              Balance Overall balance assessment: Needs assistance Sitting-balance support: Feet supported;No upper extremity supported Sitting balance-Leahy Scale: Good Sitting balance - Comments: to don underwear and socks   Standing balance support: Bilateral upper extremity supported Standing balance-Leahy Scale: Fair                             ADL either performed or assessed with clinical judgement   ADL Overall ADL's : Needs  assistance/impaired Eating/Feeding: Modified independent;Sitting   Grooming: Supervision/safety;Standing;Wash/dry hands;Wash/dry face Grooming Details (indicate cue type and reason): sink level Upper Body Bathing: Modified independent;Sitting   Lower Body Bathing: Min guard;Sit to/from stand Lower Body Bathing Details (indicate cue type and reason): discussed shower safety - non-slip mats Upper Body Dressing : Modified independent;Sitting   Lower Body Dressing: Min guard;Sit to/from stand;Cueing for sequencing Lower Body Dressing Details (indicate cue type and reason): educated to dress RLE first, Pt able to don underwear and socks without assist Toilet Transfer: Min guard;Ambulation;Comfort height toilet;RW;Grab bars   Toileting- Clothing Manipulation and Hygiene: Modified independent   Tub/ Shower Transfer: Walk-in shower;Min guard;Ambulation;Rolling walker Tub/Shower Transfer Details (indicate cue type and reason): Discussed options for shower chairs, Pt states that at this time, he feels like he does not need one and OT is in agreement. Wife will be present during bathing for safety as well upon discharge Functional mobility during ADLs: Min guard;Rolling walker General ADL Comments: Pt with good family support to assist with ADL at [facility] Patient Visual Report: No change from baseline       Perception     Praxis      Pertinent Vitals/Pain Pain Assessment: 0-10 Pain Score: 3  Pain Location: R hip Pain Descriptors / Indicators: Dull;Pressure ("deep bruising") Pain Intervention(s): Monitored during session;Repositioned;Premedicated before session;Ice applied     Hand Dominance Right   Extremity/Trunk Assessment Upper Extremity Assessment Upper Extremity Assessment: Overall WFL for tasks assessed  Lower Extremity Assessment Lower Extremity Assessment: RLE deficits/detail RLE Deficits / Details: deficits in strength and ROM post-op    Cervical / Trunk  Assessment Cervical / Trunk Assessment: Normal   Communication Communication Communication: No difficulties   Cognition Arousal/Alertness: Awake/alert Behavior During Therapy: WFL for tasks assessed/performed Overall Cognitive Status: Within Functional Limits for tasks assessed                                     General Comments  Wife in room for session    Exercises Exercises: Total Joint Total Joint Exercises Ankle Circles/Pumps: Both;10 reps Quad Sets: Right;10 reps Towel Squeeze: Right;10 reps Short Arc Quad: Right;10 reps Heel Slides: Right;10 reps Hip ABduction/ADduction: Right;10 reps   Shoulder Instructions      Home Living Family/patient expects to be discharged to:: Private residence Living Arrangements: Spouse/significant other Available Help at Discharge: Family Type of Home: House Home Access: Stairs to enter Technical brewer of Steps: 2   Home Layout: One level     Bathroom Shower/Tub: Occupational psychologist: Standard         Additional Comments: toilet in close proximity to sink - used sink for leverage prior to surgery       Prior Functioning/Environment Level of Independence: Independent with assistive device(s)        Comments: intermittent use of SPC        OT Problem List: Decreased strength;Decreased range of motion;Decreased activity tolerance;Impaired balance (sitting and/or standing);Decreased knowledge of precautions;Decreased knowledge of use of DME or AE;Pain      OT Treatment/Interventions:      OT Goals(Current goals can be found in the care plan section) Acute Rehab OT Goals Patient Stated Goal: get back to walking OT Goal Formulation: With patient Time For Goal Achievement: 10/22/16 Potential to Achieve Goals: Good  OT Frequency:     Barriers to D/C:            Co-evaluation              AM-PAC PT "6 Clicks" Daily Activity     Outcome Measure Help from another person  eating meals?: None Help from another person taking care of personal grooming?: A Little Help from another person toileting, which includes using toliet, bedpan, or urinal?: A Little Help from another person bathing (including washing, rinsing, drying)?: A Little Help from another person to put on and taking off regular upper body clothing?: None Help from another person to put on and taking off regular lower body clothing?: A Little 6 Click Score: 20   End of Session Equipment Utilized During Treatment: Gait belt;Rolling walker Nurse Communication: Mobility status  Activity Tolerance: Patient tolerated treatment well Patient left: in bed;with call bell/phone within reach;with family/visitor present;with SCD's reapplied  OT Visit Diagnosis: Other abnormalities of gait and mobility (R26.89);Pain Pain - Right/Left: Right Pain - part of body: Hip                Time: 4982-6415 OT Time Calculation (min): 29 min Charges:  OT General Charges $OT Visit: 1 Procedure OT Evaluation $OT Eval Moderate Complexity: 1 Procedure OT Treatments $Self Care/Home Management : 8-22 mins G-Codes:     Hulda Humphrey OTR/L [place] 10/08/2016, 12:05 PM

## 2016-10-08 NOTE — Progress Notes (Signed)
Physical Therapy Treatment Patient Details Name: Ronald Daniels MRN: [id number] DOB: 11/16/1955 Today's Date: 10/08/2016    History of Present Illness Patient is a 61 y/o male s/p elective R THA on 09/07/16. No pertinent past medical history.     PT Comments    Patient continues to progress well with mobility and able to safely negotiate stairs this session. Current plan remains appropriate.    Follow Up Recommendations  DC plan and follow up therapy as arranged by surgeon     Equipment Recommendations  Rolling walker with 5" wheels    Recommendations for Other Services       Precautions / Restrictions Precautions Precautions: Anterior Hip Restrictions Weight Bearing Restrictions: Yes RLE Weight Bearing: Weight bearing as tolerated    Mobility  Bed Mobility Overal bed mobility: Modified Independent             General bed mobility comments: increased time and effort  Transfers Overall transfer level: Needs assistance Equipment used: Rolling walker (2 wheeled) Transfers: Sit to/from Stand Sit to Stand: Min guard         General transfer comment: min guard for safety; pt a little impulsive and stood without RW close by; cues for safety  Ambulation/Gait Ambulation/Gait assistance: Supervision Ambulation Distance (Feet): 400 Feet Assistive device: Rolling walker (2 wheeled) Gait Pattern/deviations: Step-through pattern;Decreased weight shift to right     General Gait Details: cues for posture   Stairs Stairs: Yes   Stair Management: No rails;Step to pattern;Forwards;Backwards;With walker Number of Stairs:  (4 steps X2) General stair comments: cues for sequencing and technique; practiced ascending forward without rails and backwards with RW; assist to stabilize RW   Wheelchair Mobility    Modified Rankin (Stroke Patients Only)       Balance Overall balance assessment: Needs assistance Sitting-balance support: Feet supported;No upper extremity  supported Sitting balance-Leahy Scale: Good       Standing balance-Leahy Scale: Fair                              Cognition Arousal/Alertness: Awake/alert Behavior During Therapy: WFL for tasks assessed/performed Overall Cognitive Status: Within Functional Limits for tasks assessed                                        Exercises      General Comments General comments (skin integrity, edema, etc.): daughter in room      Pertinent Vitals/Pain Pain Assessment: 0-10 Pain Score: 2  Pain Location: R hip Pain Descriptors / Indicators: Sore ("deep bruising") Pain Intervention(s): Monitored during session;Premedicated before session;Repositioned;Ice applied    Home Living                      Prior Function            PT Goals (current goals can now be found in the care plan section) Acute Rehab PT Goals PT Goal Formulation: With patient/family Time For Goal Achievement: 10/15/16 Potential to Achieve Goals: Good Progress towards PT goals: Progressing toward goals    Frequency    7X/week      PT Plan Current plan remains appropriate    Co-evaluation              AM-PAC PT "6 Clicks" Daily Activity  Outcome Measure  Difficulty turning over in bed (including  adjusting bedclothes, sheets and blankets)?: None Difficulty moving from lying on back to sitting on the side of the bed? : None Difficulty sitting down on and standing up from a chair with arms (e.g., wheelchair, bedside commode, etc,.)?: A Little Help needed moving to and from a bed to chair (including a wheelchair)?: A Little Help needed walking in hospital room?: A Little Help needed climbing 3-5 steps with a railing? : A Little 6 Click Score: 20    End of Session Equipment Utilized During Treatment: Gait belt Activity Tolerance: Patient tolerated treatment well Patient left: in bed;with call bell/phone within reach;with family/visitor present Nurse  Communication: Mobility status PT Visit Diagnosis: Difficulty in walking, not elsewhere classified (R26.2);Muscle weakness (generalized) (M62.81);Other abnormalities of gait and mobility (R26.89)     Time: 0175-1025 PT Time Calculation (min) (ACUTE ONLY): 26 min  Charges:  $Gait Training: 23-37 mins                    G Codes:       Earney Navy, PTA Pager: [phone]     Darliss Cheney 10/08/2016, 4:26 PM

## 2016-10-08 NOTE — Progress Notes (Signed)
Physical Therapy Evaluation Patient Details Name: Ronald Daniels MRN: [id number] DOB: 1956/03/29 Today's Date: 10/08/2016   History of Present Illness  Patient is a 61 y/o male s/p elective R THA on 09/07/16. No pertinent past medical history.   Clinical Impression  Ronald Daniels presents with the above listed surgical procedure. Patient is currently WBAT on R LE with ability to transfer and ambulate with RW with Min Guard for overall safety. Good ability to perform HEP and move LE against gravity. Patient with good family support with plans to return home upon d/c. Pt will benefit from skilled PT to increase their independence and safety with mobility to allow discharge to the venue listed below.       Follow Up Recommendations DC plan and follow up therapy as arranged by surgeon    Equipment Recommendations  Rolling walker with 5" wheels    Recommendations for Other Services       Precautions / Restrictions Precautions Precautions: Anterior Hip Restrictions Weight Bearing Restrictions: Yes RLE Weight Bearing: Weight bearing as tolerated      Mobility  Bed Mobility Overal bed mobility: Modified Independent                Transfers Overall transfer level: Needs assistance Equipment used: Rolling walker (2 wheeled) Transfers: Sit to/from Stand Sit to Stand: Min guard            Ambulation/Gait Ambulation/Gait assistance: Min guard Ambulation Distance (Feet): 510 Feet Assistive device: Rolling walker (2 wheeled) Gait Pattern/deviations: Step-through pattern;Decreased step length - left;Decreased stance time - right;Decreased weight shift to right        Science writer    Modified Rankin (Stroke Patients Only)       Balance Overall balance assessment: Needs assistance Sitting-balance support: Feet supported;No upper extremity supported Sitting balance-Leahy Scale: Good     Standing balance support: Bilateral upper extremity  supported                                 Pertinent Vitals/Pain Pain Assessment: 0-10 Pain Score: 2  Pain Location: R hip Pain Descriptors / Indicators: Dull;Pressure ("deep bruising") Pain Intervention(s): Monitored during session;Ice applied    Home Living Family/patient expects to be discharged to:: Private residence Living Arrangements: Spouse/significant other Available Help at Discharge: Family Type of Home: House Home Access: Stairs to enter   Technical brewer of Steps: 2 Home Layout: One level   Additional Comments: toilet in close proximity to sink - used sink for leverage prior to surgery     Prior Function Level of Independence: Independent with assistive device(s)         Comments: intermittent use of SPC     Hand Dominance        Extremity/Trunk Assessment   Upper Extremity Assessment Upper Extremity Assessment: Defer to OT evaluation    Lower Extremity Assessment Lower Extremity Assessment: Generalized weakness (due to s/p R THA)       Communication   Communication: No difficulties  Cognition Arousal/Alertness: Awake/alert Behavior During Therapy: WFL for tasks assessed/performed Overall Cognitive Status: Within Functional Limits for tasks assessed                                        General Comments  Exercises Total Joint Exercises Ankle Circles/Pumps: Both;10 reps Quad Sets: Right;10 reps Towel Squeeze: Right;10 reps Short Arc Quad: Right;10 reps Heel Slides: Right;10 reps Hip ABduction/ADduction: Right;10 reps   Assessment/Plan    PT Assessment Patient needs continued PT services  PT Problem List Decreased strength;Decreased activity tolerance;Decreased balance;Decreased mobility       PT Treatment Interventions DME instruction;Gait training;Stair training;Functional mobility training;Therapeutic activities;Therapeutic exercise;Balance training;Patient/family education    PT Goals  (Current goals can be found in the Care Plan section)  Acute Rehab PT Goals Patient Stated Goal: get back to walking PT Goal Formulation: With patient/family Time For Goal Achievement: 10/15/16 Potential to Achieve Goals: Good    Frequency BID   Barriers to discharge        Co-evaluation               AM-PAC PT "6 Clicks" Daily Activity  Outcome Measure Difficulty turning over in bed (including adjusting bedclothes, sheets and blankets)?: None Difficulty moving from lying on back to sitting on the side of the bed? : None Difficulty sitting down on and standing up from a chair with arms (e.g., wheelchair, bedside commode, etc,.)?: A Little Help needed moving to and from a bed to chair (including a wheelchair)?: A Little Help needed walking in hospital room?: A Little Help needed climbing 3-5 steps with a railing? : A Little 6 Click Score: 20    End of Session Equipment Utilized During Treatment: Gait belt Activity Tolerance: Patient tolerated treatment well Patient left: in bed;with call bell/phone within reach;with family/visitor present Nurse Communication: Mobility status PT Visit Diagnosis: Difficulty in walking, not elsewhere classified (R26.2);Muscle weakness (generalized) (M62.81);Other abnormalities of gait and mobility (R26.89)    Time: 9983-3825 PT Time Calculation (min) (ACUTE ONLY): 33 min   Charges:   PT Evaluation $PT Eval Low Complexity: 1 Procedure PT Treatments $Gait Training: 8-22 mins   PT G Codes:        Ronald Daniels, PT, DPT 10/08/16 10:57 AM

## 2016-10-08 NOTE — Care Management Note (Signed)
Case Management Note  Patient Details  Name: Ronald Daniels MRN: [id number] Date of Birth: 11/01/1955  Subjective/Objective:   61 yr old gentleman s/p right total hip arthroplasty.                  Action/Plan: Case manager spoke with patient and family concerning discharge plan and DME needs. Choice for Home Health agency was offered. Patient says he may not need HH, but would accept if needed. Referral was called to [name], [facility] Liaison. Patient will have family support at discharge. CM explained to patient that Dr. [name] may say he doesn't need [facility] and we can cancel referral.    Expected Discharge Date:    10/09/16              Expected Discharge Plan:  [place]  In-House Referral:  NA  Discharge planning Services  CM Consult  Post Acute Care Choice:  Durable Medical Equipment, Home Health Choice offered to:  Patient, Spouse  DME Arranged:  Walker rolling DME Agency:  [place]:  PT [facility] Agency:  [place]  Status of Service:  Completed, signed off  If discussed at [place] of Stay Meetings, dates discussed:    Additional Comments:  [name], RN 10/08/2016, 12:23 PM

## 2016-10-08 NOTE — Progress Notes (Signed)
Subjective: 1 Day Post-Op Procedure(s) (LRB): RIGHT TOTAL HIP ARTHROPLASTY ANTERIOR APPROACH (Right) Patient reports pain as moderate.    Objective: Vital signs in last 24 hours: Temp:  [97.1 F (36.2 C)-98.4 F (36.9 C)] 97.1 F (36.2 C) (07/04 0627) Pulse Rate:  [71-92] 89 (07/04 0627) Resp:  [11-20] 18 (07/04 0627) BP: (126-163)/(81-106) 134/87 (07/04 0627) SpO2:  [94 %-100 %] 95 % (07/04 0627) Weight:  [205 lb (93 kg)-207 lb 14.3 oz (94.3 kg)] 207 lb 14.3 oz (94.3 kg) (07/03 2027)  Intake/Output from previous day: 07/03 0701 - 07/04 0700 In: 1160 [I.V.:1060; IV Piggyback:100] Out: 250 [Blood:250] Intake/Output this shift: No intake/output data recorded.   Recent Labs  10/08/16 0506  HGB 14.3    Recent Labs  10/08/16 0506  WBC 9.9  RBC 4.63  HCT 42.5  PLT 170    Recent Labs  10/08/16 0506  NA 137  K 4.3  CL 104  CO2 25  BUN 17  CREATININE 1.10  GLUCOSE 172*  CALCIUM 8.7*   No results for input(s): LABPT, INR in the last 72 hours.  Sensation intact distally Intact pulses distally Dorsiflexion/Plantar flexion intact Incision: dressing C/D/I  Assessment/Plan: 1 Day Post-Op Procedure(s) (LRB): RIGHT TOTAL HIP ARTHROPLASTY ANTERIOR APPROACH (Right) Up with therapy  Mcarthur Rossetti 10/08/2016, 7:51 AM

## 2016-10-09 ENCOUNTER — Encounter (HOSPITAL_COMMUNITY): Payer: Self-pay | Admitting: Orthopaedic Surgery

## 2016-10-09 MED ORDER — ASPIRIN 81 MG PO CHEW: 81.0000 mg | CHEWABLE_TABLET | Freq: Two times a day (BID) | ORAL | 0 refills | Status: AC

## 2016-10-09 MED ORDER — CYCLOBENZAPRINE HCL 10 MG PO TABS: 10.0000 mg | ORAL_TABLET | Freq: Three times a day (TID) | ORAL | 0 refills | Status: AC | PRN

## 2016-10-09 MED ORDER — OXYCODONE-ACETAMINOPHEN 5-325 MG PO TABS: 1.0000 | ORAL_TABLET | ORAL | 0 refills | Status: DC | PRN

## 2016-10-09 NOTE — Plan of Care (Signed)
Problem: Safety: Goal: Ability to remain free from injury will improve Patient's call light and belongings are within reach. Patient voices understanding about calling for assistance.

## 2016-10-09 NOTE — Progress Notes (Signed)
Physical Therapy Treatment Patient Details Name: Ronald Daniels MRN: [id number] DOB: 1955-12-13 Today's Date: 10/09/2016    History of Present Illness Patient is a 61 y/o male s/p elective R THA on 09/07/16. No pertinent past medical history.     PT Comments    Pt performed gait, reviewed stair training and reviewed HEP in prep for d/c home today.  Pt remains impulsive and eager to d/c home.  Informed nursing patient is ready from a mobility standpoint to d/c home.    Follow Up Recommendations  DC plan and follow up therapy as arranged by surgeon     Equipment Recommendations  Rolling walker with 5" wheels    Recommendations for Other Services       Precautions / Restrictions Precautions Precautions: None Restrictions Weight Bearing Restrictions: Yes RLE Weight Bearing: Weight bearing as tolerated    Mobility  Bed Mobility Overal bed mobility: Modified Independent             General bed mobility comments: increased time and effort  Transfers Overall transfer level: Needs assistance Equipment used: Rolling walker (2 wheeled) Transfers: Sit to/from Stand Sit to Stand: Supervision         General transfer comment: Pt remains impulsive during transfers standing without RW near by.  PTA quickly brought RW closer.    Ambulation/Gait Ambulation/Gait assistance: Supervision Ambulation Distance (Feet): 600 Feet Assistive device: Rolling walker (2 wheeled) Gait Pattern/deviations: Step-through pattern;Decreased weight shift to right;Shuffle;Trunk flexed   Gait velocity interpretation: Below normal speed for age/gender General Gait Details: Cues for upper trunk control and cues for RW height for correct fit.  Pt with short stance on R and cues for gait symmetry.     Stairs Stairs: Yes   Stair Management: No rails;Step to pattern;Forwards;Backwards;With walker;With cane Number of Stairs: 4 (x2 with RW and x2 with cane on R side as patient refused to use cane  opposite of his affected extremity.  ) General stair comments: cues for sequencing and technique; practiced ascending forward without rails and cane and backwards with RW; assist to stabilize RW   Wheelchair Mobility    Modified Rankin (Stroke Patients Only)       Balance Overall balance assessment: Needs assistance Sitting-balance support: Feet supported;No upper extremity supported Sitting balance-Leahy Scale: Good       Standing balance-Leahy Scale: Fair                              Cognition Arousal/Alertness: Awake/alert Behavior During Therapy: WFL for tasks assessed/performed Overall Cognitive Status: Within Functional Limits for tasks assessed                                        Exercises Total Joint Exercises Ankle Circles/Pumps: AROM;Both;10 reps;Supine Quad Sets: AROM;Left;10 reps;Supine Short Arc Quad: AROM;Left;10 reps;Supine Heel Slides: AROM;Left;10 reps;Supine Hip ABduction/ADduction: AROM;Left;Standing;20 reps (1x10 supine and 1x10 in standing.  ) Long Arc Quad: AROM;Left;10 reps;Seated    General Comments        Pertinent Vitals/Pain Pain Assessment: 0-10 Pain Location: R hip Pain Descriptors / Indicators: Sore Pain Intervention(s): Monitored during session;Repositioned    Home Living                      Prior Function            PT  Goals (current goals can now be found in the care plan section) Acute Rehab PT Goals Patient Stated Goal: To go home Potential to Achieve Goals: Good Progress towards PT goals: Progressing toward goals    Frequency    7X/week      PT Plan Current plan remains appropriate    Co-evaluation              AM-PAC PT "6 Clicks" Daily Activity  Outcome Measure  Difficulty turning over in bed (including adjusting bedclothes, sheets and blankets)?: None Difficulty moving from lying on back to sitting on the side of the bed? : None Difficulty sitting down on  and standing up from a chair with arms (e.g., wheelchair, bedside commode, etc,.)?: None Help needed moving to and from a bed to chair (including a wheelchair)?: None Help needed walking in hospital room?: A Little Help needed climbing 3-5 steps with a railing? : A Little 6 Click Score: 22    End of Session Equipment Utilized During Treatment: Gait belt Activity Tolerance: Patient tolerated treatment well Patient left: in bed;with call bell/phone within reach;with family/visitor present Nurse Communication: Mobility status PT Visit Diagnosis: Difficulty in walking, not elsewhere classified (R26.2);Muscle weakness (generalized) (M62.81);Other abnormalities of gait and mobility (R26.89)     Time: 1041-1105 PT Time Calculation (min) (ACUTE ONLY): 24 min  Charges:  $Gait Training: 8-22 mins $Therapeutic Exercise: 8-22 mins                    G Codes:       Governor Rooks, PTA pager [place] 10/09/2016, 5:19 PM

## 2016-10-09 NOTE — Discharge Summary (Signed)
Patient ID: Ronald Daniels MRN: [id number] DOB/AGE: 60-Sep-1957 60 y.o.  Admit date: 10/07/2016 Discharge date: 10/09/2016  Admission Diagnoses:  Principal Problem:   Unilateral primary osteoarthritis, right hip Active Problems:   Status post total replacement of right hip   Discharge Diagnoses:  Same  Past Medical History:  Diagnosis Date  . Arthritis   . Headache    Sinus headache  . History of kidney stones   . Hyperlipidemia     Surgeries: Procedure(s): RIGHT TOTAL HIP ARTHROPLASTY ANTERIOR APPROACH on 10/07/2016   Consultants:   Discharged Condition: Improved  Hospital Course: Ronald Daniels is an 61 y.o. male who was admitted 10/07/2016 for operative treatment ofUnilateral primary osteoarthritis, right hip. Patient has severe unremitting pain that affects sleep, daily activities, and work/hobbies. After pre-op clearance the patient was taken to the operating room on 10/07/2016 and underwent  Procedure(s): RIGHT TOTAL HIP ARTHROPLASTY ANTERIOR APPROACH.    Patient was given perioperative antibiotics: Anti-infectives    Start     Dose/Rate Route Frequency Ordered Stop   10/07/16 2130  ceFAZolin (ANCEF) IVPB 1 g/50 mL premix     1 g 100 mL/hr over 30 Minutes Intravenous Every 6 hours 10/07/16 1839 10/08/16 0356   10/07/16 1500  ceFAZolin (ANCEF) IVPB 2g/100 mL premix     2 g 200 mL/hr over 30 Minutes Intravenous To [facility] 10/06/16 1158 10/07/16 1550       Patient was given sequential compression devices, early ambulation, and chemoprophylaxis to prevent DVT.  Patient benefited maximally from hospital stay and there were no complications.    Recent vital signs: Patient Vitals for the past 24 hrs:  BP Temp Temp src Pulse Resp SpO2  10/09/16 0455 136/80 98.6 F (37 C) Oral 64 18 97 %  10/08/16 2025 (!) 164/88 98.6 F (37 C) Oral 71 18 97 %     Recent laboratory studies:  Recent Labs  10/08/16 0506  WBC 9.9  HGB 14.3  HCT 42.5  PLT 170  NA 137  K  4.3  CL 104  CO2 25  BUN 17  CREATININE 1.10  GLUCOSE 172*  CALCIUM 8.7*     Discharge Medications:   Allergies as of 10/09/2016      Reactions   No Known Allergies       Medication List    TAKE these medications   aspirin 81 MG chewable tablet Chew 1 tablet (81 mg total) by mouth 2 (two) times daily.   cyclobenzaprine 10 MG tablet Commonly known as:  FLEXERIL Take 1 tablet (10 mg total) by mouth 3 (three) times daily as needed. For back pain/spasms. What changed:  when to take this   Dextromethorphan-Guaifenesin 10-200 MG Caps Take 1-2 tablets by mouth 3 (three) times daily as needed (for sinus/cold or flu).   fexofenadine 180 MG tablet Commonly known as:  ALLEGRA Take 180 mg by mouth at bedtime.   gabapentin 300 MG capsule Commonly known as:  NEURONTIN Take 300 mg by mouth 2 (two) times daily.   ibuprofen 200 MG tablet Commonly known as:  ADVIL,MOTRIN Take 800 mg by mouth every 8 (eight) hours as needed (for pain (back & legs)).   oxyCODONE-acetaminophen 5-325 MG tablet Commonly known as:  ROXICET Take 1-2 tablets by mouth every 4 (four) hours as needed.   oxymetazoline 0.05 % nasal spray Commonly known as:  AFRIN Place 1 spray into both nostrils 3 (three) times daily as needed for congestion.   PROBIOTIC PO Take 2  capsules by mouth at bedtime.   psyllium 0.52 g capsule Commonly known as:  REGULOID Take 2.08 g by mouth at bedtime.   simvastatin 20 MG tablet Commonly known as:  ZOCOR Take 20 mg by mouth at bedtime.            Durable Medical Equipment        Start     Ordered   10/07/16 1840  DME Walker rolling  Once    Question:  Patient needs a walker to treat with the following condition  Answer:  Status post total replacement of right hip   10/07/16 1839   10/07/16 1840  DME 3 n 1  Once     10/07/16 1839      Diagnostic Studies: Dg Pelvis Portable  Result Date: 10/07/2016 CLINICAL DATA:  Status post right hip replacement today. EXAM:  PORTABLE PELVIS 1-2 VIEWS COMPARISON:  Intraoperative imaging this same day. FINDINGS: Right total hip arthroplasty is in place. The device is located. No fracture. Left hip osteoarthritis is noted. Surgical clips projecting over the right superior pubic ramus are likely due to prior hernia repair. IMPRESSION: Right total hip replacement in place.  No acute finding. Electronically Signed   By: Inge Rise M.D.   On: 10/07/2016 18:11   Dg C-arm 1-60 Min  Result Date: 10/07/2016 CLINICAL DATA:  Right total hip replacement, anterior approach. Intraoperative images. EXAM: DG C-ARM 61-120 MIN; OPERATIVE RIGHT HIP WITH PELVIS COMPARISON:  None. FINDINGS: Two frontal projections dictate the lower pelvis and the right hip, and 1 of these includes the entire prosthesis. Screw fixation of the acetabular shell component noted. Expected positioning and alignment, with no visible fracture or complicating feature. Right inguinal clips incidentally noted. IMPRESSION: 1. Right total hip prosthesis in place without complicating feature. Electronically Signed   By: Van Clines M.D.   On: 10/07/2016 17:20   Dg Hip Operative Unilat W Or W/o Pelvis Right  Result Date: 10/07/2016 CLINICAL DATA:  Right total hip replacement, anterior approach. Intraoperative images. EXAM: DG C-ARM 61-120 MIN; OPERATIVE RIGHT HIP WITH PELVIS COMPARISON:  None. FINDINGS: Two frontal projections dictate the lower pelvis and the right hip, and 1 of these includes the entire prosthesis. Screw fixation of the acetabular shell component noted. Expected positioning and alignment, with no visible fracture or complicating feature. Right inguinal clips incidentally noted. IMPRESSION: 1. Right total hip prosthesis in place without complicating feature. Electronically Signed   By: Van Clines M.D.   On: 10/07/2016 17:20    Disposition: to home  Discharge Instructions    Discharge patient    Complete by:  As directed    Discharge  disposition:  01-Home or Self Care   Discharge patient date:  10/09/2016      Follow-up Information    [facility].   Why:  A representative from [place] will contact you to arrange start date and time for your therapy. Contact information: [place] [address] [phone]        Mcarthur Rossetti, MD Follow up in 2 week(s).   Specialty:  Orthopedic Surgery Contact information: [place] [address] [phone]            Signed: Mcarthur Rossetti 10/09/2016, 10:05 AM

## 2016-10-09 NOTE — Discharge Instructions (Signed)

## 2016-10-09 NOTE — Progress Notes (Signed)
Subjective: 2 Days Post-Op Procedure(s) (LRB): RIGHT TOTAL HIP ARTHROPLASTY ANTERIOR APPROACH (Right) Patient reports pain as moderate.  Doing very well with therapy.  Objective: Vital signs in last 24 hours: Temp:  [98.6 F (37 C)] 98.6 F (37 C) (07/05 0455) Pulse Rate:  [64-71] 64 (07/05 0455) Resp:  [18] 18 (07/05 0455) BP: (136-164)/(80-88) 136/80 (07/05 0455) SpO2:  [97 %] 97 % (07/05 0455)  Intake/Output from previous day: 07/04 0701 - 07/05 0700 In: 240 [P.O.:240] Out: -  Intake/Output this shift: Total I/O In: 360 [P.O.:360] Out: -    Recent Labs  10/08/16 0506  HGB 14.3    Recent Labs  10/08/16 0506  WBC 9.9  RBC 4.63  HCT 42.5  PLT 170    Recent Labs  10/08/16 0506  NA 137  K 4.3  CL 104  CO2 25  BUN 17  CREATININE 1.10  GLUCOSE 172*  CALCIUM 8.7*   No results for input(s): LABPT, INR in the last 72 hours.  Sensation intact distally Intact pulses distally Dorsiflexion/Plantar flexion intact Incision: scant drainage  Assessment/Plan: 2 Days Post-Op Procedure(s) (LRB): RIGHT TOTAL HIP ARTHROPLASTY ANTERIOR APPROACH (Right) Up with therapy Discharge home with home health today.  Mcarthur Rossetti 10/09/2016, 10:03 AM

## 2016-10-09 NOTE — Op Note (Signed)
NAME:  Ronald Daniels, Ronald Daniels NO.:  MEDICAL RECORD NO.:  [id number]  LOCATION:                                 FACILITY:  PHYSICIAN:  Ronald Daniels, Ronald DanielsDATE OF BIRTH:  DATE OF PROCEDURE:  10/07/2016 DATE OF DISCHARGE:                              OPERATIVE REPORT   PREOPERATIVE DIAGNOSIS:  Primary osteoarthritis and degenerative joint disease of right hip.  POSTOPERATIVE DIAGNOSIS:  Primary osteoarthritis and degenerative joint disease of right hip.  PROCEDURE:  Right total hip arthroplasty through direct anterior approach.  IMPLANTS:  DePuy Sector Gription acetabular component size 54 with a single screw, size 36+ 4 polyethylene liner, size 12 Corail femoral component with varus offset, size 36 +1.5 ceramic hip ball.  SURGEON:  Ronald Daniels, M.D.  ASSISTANT:  Erskine Emery, PA-C.  ANESTHESIA:  General.  ANTIBIOTICS:  2 g of IV Ancef.  BLOOD LOSS:  250 mL.  COMPLICATIONS:  None.  INDICATIONS:  Ronald Daniels is a very pleasant 61 year old gentleman with debilitating arthritis involving his right hip.  His x-ray showed complete loss of the superolateral joint space and significant periarticular osteophytes and sclerotic changes.  His pain is daily and it is 10/10.  At this point, it is detrimentally affected his activities of daily living, his mobility and his quality of life.  At this point, he does wish to proceed with the total hip arthroplasty.  We have explained to him the risk of acute blood loss anemia, nerve and vessel injury, fracture, infection, dislocation, DVT.  He understands our goals are to decrease pain, improve mobility and overall improved quality of life.  PROCEDURE DESCRIPTION:  After informed consent was obtained, appropriate right hip was marked.  He was brought to the operating room.  General anesthesia was obtained while he was on the stretcher.  Traction boots were placed on both of his feet.  Next, he was  placed supine on the Hana fracture table with the perineal post in place and both legs in inline skeletal traction devices, but no traction applied.  His right operative hip was prepped and draped with DuraPrep and sterile drapes.  A time-out was called and he was identified as correct patient and correct right hip.  I then made an incision just inferior and posterior to the anterior superior iliac spine and carried this obliquely down the leg. We dissected down the tensor fascia lata muscle and tensor fascia was then divided longitudinally, so I could proceed with a direct anterior approach to the hip.  I identified and cauterized the circumflex vessels and identified the hip capsule.  I opened up the hip capsule in an L- type format, finding moderate joint effusion and significant periarticular osteophytes.  We placed Cobra retractors around the medial and lateral femoral neck and then made our femoral neck cut with an oscillating saw and completed this with an osteotome.  We placed a corkscrew guide in the femoral head and removed the femoral head in its entirety and found wide areas of devoid of cartilage.  We then placed a bent Hohmann over the medial acetabular rim and removed remnants of the acetabular labrum.  I  then began reaming under direct visualization from a size 42 reamer and stepwise increments up to a size 54 with all reamers under direct visualization and the last reamer under direct fluoroscopy, so I could obtain our depth of reaming, my inclination and anteversion.  Once I was pleased with this, I placed the real DePuy Sector Gription acetabular component size 54 and a single screw.  I then placed a 36+ 4 polyethylene liner for that size acetabular component. Attention was then turned to the femur.  With the leg externally rotated to 120 degrees extended and adducted, I was able to place a Mueller retractor medially and a Hohmann retractor behind the  greater trochanter.  I used the box cutting osteotome to enter the femoral canal and a rongeur to lateralize.  We then began broaching from a size 8 broach using the Corail broaching system going up to size 12.  Based on his anatomy, we trialed a varus offset femoral neck and a 36+ 1.5 hip ball, reduced this in the acetabulum and we were pleased with leg length, offset and stability.  We then dislocated the hip and removed the trial components.  We were able to place the real Corail femoral component with varus offset size 12 and the real 36 +1.5 ceramic hip ball, and again, we reduced this in the pelvis and we were pleased with stability.  We then irrigated the soft tissue with normal saline solution using the pulsatile lavage.  We were able to close the joint capsule with interrupted #1 Ethibond suture followed by running #1 Vicryl in the tensor fascia, 0 Vicryl in the deep tissue, 2-0 Vicryl in the subcutaneous tissue, 4-0 Monocryl subcuticular stitch and Steri- Strips on the skin.  Well-padded Aquacel dressing was applied.  He was taken off the Hana table, awakened, extubated and taken to the recovery room in stable condition.  All final counts were correct.  There were no complications noted.  Of note, Erskine Emery, PA-C, assisted in the entire case, his assistance was crucial for facilitating all aspects of this case.     Ronald Daniels, M.D.     CYB/MEDQ  D:  10/07/2016  T:  10/07/2016  Job:  [id number]

## 2016-10-09 NOTE — Progress Notes (Signed)
Discharge instructions (including medications) discussed with and copy provided to patient/caregiver 

## 2016-10-10 ENCOUNTER — Telehealth (INDEPENDENT_AMBULATORY_CARE_PROVIDER_SITE_OTHER): Payer: Self-pay | Admitting: Orthopaedic Surgery

## 2016-10-10 DIAGNOSIS — Z471 Aftercare following joint replacement surgery: Secondary | ICD-10-CM | POA: Diagnosis not present

## 2016-10-10 DIAGNOSIS — Z96641 Presence of right artificial hip joint: Secondary | ICD-10-CM | POA: Diagnosis not present

## 2016-10-10 NOTE — Telephone Encounter (Signed)
PT WANTS TO KNOW WHO PAYS FOR PHYSICAL THERAPY, THAT HE DIDN'T KNOW HE WAS GETTING PT AND THAT THEY NEED PRE APPROVAL FOR THIS.  [phone]

## 2016-10-10 NOTE — Telephone Encounter (Signed)
Sent to pharmacy 

## 2016-10-10 NOTE — Anesthesia Postprocedure Evaluation (Signed)
Anesthesia Post Note  Patient: Raiyan A Mcnall  Procedure(s) Performed: Procedure(s) (LRB): RIGHT TOTAL HIP ARTHROPLASTY ANTERIOR APPROACH (Right)     Patient location during evaluation: PACU Anesthesia Type: General Level of consciousness: awake and alert Pain management: pain level controlled Vital Signs Assessment: post-procedure vital signs reviewed and stable Respiratory status: spontaneous breathing, nonlabored ventilation, respiratory function stable and patient connected to nasal cannula oxygen Cardiovascular status: blood pressure returned to baseline and stable Postop Assessment: no signs of nausea or vomiting Anesthetic complications: no    Last Vitals:  Vitals:   10/08/16 2025 10/09/16 0455  BP: (!) 164/88 136/80  Pulse: 71 64  Resp: 18 18  Temp: 37 C 37 C    Last Pain:  Vitals:   10/09/16 0455  TempSrc: Oral  PainSc:                  Meryl Ponder

## 2016-10-15 ENCOUNTER — Telehealth (INDEPENDENT_AMBULATORY_CARE_PROVIDER_SITE_OTHER): Payer: Self-pay | Admitting: Orthopaedic Surgery

## 2016-10-15 ENCOUNTER — Other Ambulatory Visit (INDEPENDENT_AMBULATORY_CARE_PROVIDER_SITE_OTHER): Payer: Self-pay

## 2016-10-15 MED ORDER — OXYCODONE-ACETAMINOPHEN 5-325 MG PO TABS: 1.0000 | ORAL_TABLET | ORAL | 0 refills | Status: AC | PRN

## 2016-10-15 NOTE — Telephone Encounter (Signed)
Giving another Rx so I can do a PA if needed

## 2016-10-15 NOTE — Telephone Encounter (Signed)
PT WANTS ANOTHER RX TO BE SENT IN DUE TO HIS INSURANCE NOT COVERING OTHER ONE.  [phone]

## 2016-10-16 ENCOUNTER — Telehealth (INDEPENDENT_AMBULATORY_CARE_PROVIDER_SITE_OTHER): Payer: Self-pay | Admitting: Orthopaedic Surgery

## 2016-10-21 ENCOUNTER — Encounter (INDEPENDENT_AMBULATORY_CARE_PROVIDER_SITE_OTHER): Payer: Self-pay | Admitting: Orthopaedic Surgery

## 2016-10-21 ENCOUNTER — Ambulatory Visit (INDEPENDENT_AMBULATORY_CARE_PROVIDER_SITE_OTHER): Payer: 59 | Admitting: Orthopaedic Surgery

## 2016-10-21 DIAGNOSIS — Z96641 Presence of right artificial hip joint: Secondary | ICD-10-CM

## 2016-10-21 NOTE — Progress Notes (Signed)
The patient is 14 days status post a right total hip arthroplasty through direct injury approach. He is walking with a walking stick. He feels like he is ready to drive.  On examination he does have a large postoperative seroma. I was able to clean it with Betadine and alcohol and drained about 120 mL of serosanguineous fluid from around the hip. There is no sign infection. This gave him immediate relief. His leg lengths are equal. His incision looks really good. New Steri-Strips were applied.  His in the continue his activities as he tolerates. He can drive at this standpoint. If the seroma recurs he'll let us know. He'll try heat. He already got a refill of pain medications last week. We'll see him back in a month otherwise see how is doing overall but no x-rays are needed.

## 2016-10-24 ENCOUNTER — Telehealth (INDEPENDENT_AMBULATORY_CARE_PROVIDER_SITE_OTHER): Payer: Self-pay | Admitting: Radiology

## 2016-10-24 NOTE — Telephone Encounter (Signed)
fyi

## 2016-10-24 NOTE — Telephone Encounter (Signed)
Patient called and states that he is having continued pain and difficulty with a hemorrhoid. He and Dr. [name] spoke about this at his last visit and the patient was told that if he continued to have problems, he could call the office and we would work on getting him an appointment with Dr. Nedra Hai to evaluate. Patient requests appt to be made with Dr. Nedra Hai.  I spoke with [facility] to schedule appt with Dr. Nedra Hai. She asked for demographics to be faxed to her at [phone].  She will call patient to schedule.  Information faxed.

## 2016-10-27 ENCOUNTER — Ambulatory Visit (INDEPENDENT_AMBULATORY_CARE_PROVIDER_SITE_OTHER): Payer: 59 | Admitting: Orthopaedic Surgery

## 2016-10-27 DIAGNOSIS — Z96641 Presence of right artificial hip joint: Secondary | ICD-10-CM

## 2016-10-27 DIAGNOSIS — K645 Perianal venous thrombosis: Secondary | ICD-10-CM | POA: Diagnosis not present

## 2016-10-27 NOTE — Progress Notes (Signed)
The patient comes in today one week after I saw him in his regular follow-up status post a right total hip arthroplasty. He has had a reaccumulation of the seroma like to have this drained again.  On exam it shows no evidence infection of his right hip. His incision line was great. There is a moderate seroma. I was able to drain 100 mL of serosanguineous fluid from this area. There is no evidence infection is gave him great relief. He'll keep his follow-up with me in about 3 weeks from now but no x-rays are needed. Obviously if he does get a reaccumulation of fluid again I'm happy to drain the soft if it's uncomfortable. All questions were encouraged and answered.

## 2016-10-30 ENCOUNTER — Telehealth (INDEPENDENT_AMBULATORY_CARE_PROVIDER_SITE_OTHER): Payer: Self-pay | Admitting: Orthopaedic Surgery

## 2016-10-30 NOTE — Telephone Encounter (Signed)
Plan of Treatment faxed 10/29/16

## 2016-10-30 NOTE — Telephone Encounter (Signed)
Home Health Certification and Plan of Care faxed 10/30/16

## 2016-11-20 ENCOUNTER — Ambulatory Visit (INDEPENDENT_AMBULATORY_CARE_PROVIDER_SITE_OTHER): Payer: 59 | Admitting: Orthopaedic Surgery

## 2016-11-20 DIAGNOSIS — Z96641 Presence of right artificial hip joint: Secondary | ICD-10-CM

## 2016-11-20 MED ORDER — HYDROCODONE-ACETAMINOPHEN 5-325 MG PO TABS: 1.0000 | ORAL_TABLET | Freq: Two times a day (BID) | ORAL | 0 refills | Status: AC | PRN

## 2016-11-20 NOTE — Progress Notes (Signed)
The patient is now 6 weeks status post a right total hip arthroplasty. He has had hemorrhoid surgery in the interim is doing much better overall. Exam related with a cane on occasion and walk about 4 miles a day.  On examination he does still have a slight seroma but at 6 weeks it is more firm and we do not need to drain it. He understands this as well and does not want to drain.Ronald Daniels He is otherwise comfortable with internal/external rotation of his hip. He is sleeping better as well.  At this point we'll need see him back for 4-5 weeks and he'll increase activities as comfort allows. I did give her prescription for hydrocodone today.

## 2016-12-29 ENCOUNTER — Ambulatory Visit (INDEPENDENT_AMBULATORY_CARE_PROVIDER_SITE_OTHER): Payer: 59 | Admitting: Orthopaedic Surgery

## 2016-12-29 DIAGNOSIS — Z96641 Presence of right artificial hip joint: Secondary | ICD-10-CM

## 2016-12-29 NOTE — Progress Notes (Signed)
The patient is almost 3 months status post a right total hip arthroplasty through direct anterior approach. He has no complaints and said he is doing well overall.  On exam his incision is healed nicely. There is no seroma. His leg lengths are equal. He tolerates me easily puddings right hip the range of motion.  This point would only see him back to the one-year standpoint after surgery. We'll have a low AP pelvis at that visit. If he has any issues before then he'll let us know. All questions and concerns were answered and addressed.

## 2017-02-03 DIAGNOSIS — Z23 Encounter for immunization: Secondary | ICD-10-CM | POA: Diagnosis not present

## 2017-10-05 ENCOUNTER — Ambulatory Visit (INDEPENDENT_AMBULATORY_CARE_PROVIDER_SITE_OTHER): Payer: Self-pay | Admitting: Orthopaedic Surgery

## 2018-02-07 IMAGING — RF DG HIP (WITH PELVIS) OPERATIVE*R*
1 series · 2 of 2 positions shown · non-contrast
Comparison: None.

CLINICAL DATA: Right total hip replacement, anterior approach.
Intraoperative images.

EXAM:
DG C-ARM 61-120 MIN; OPERATIVE RIGHT HIP WITH PELVIS

[Series 1: run · 2 of 2 slices shown]
[im 1/2]
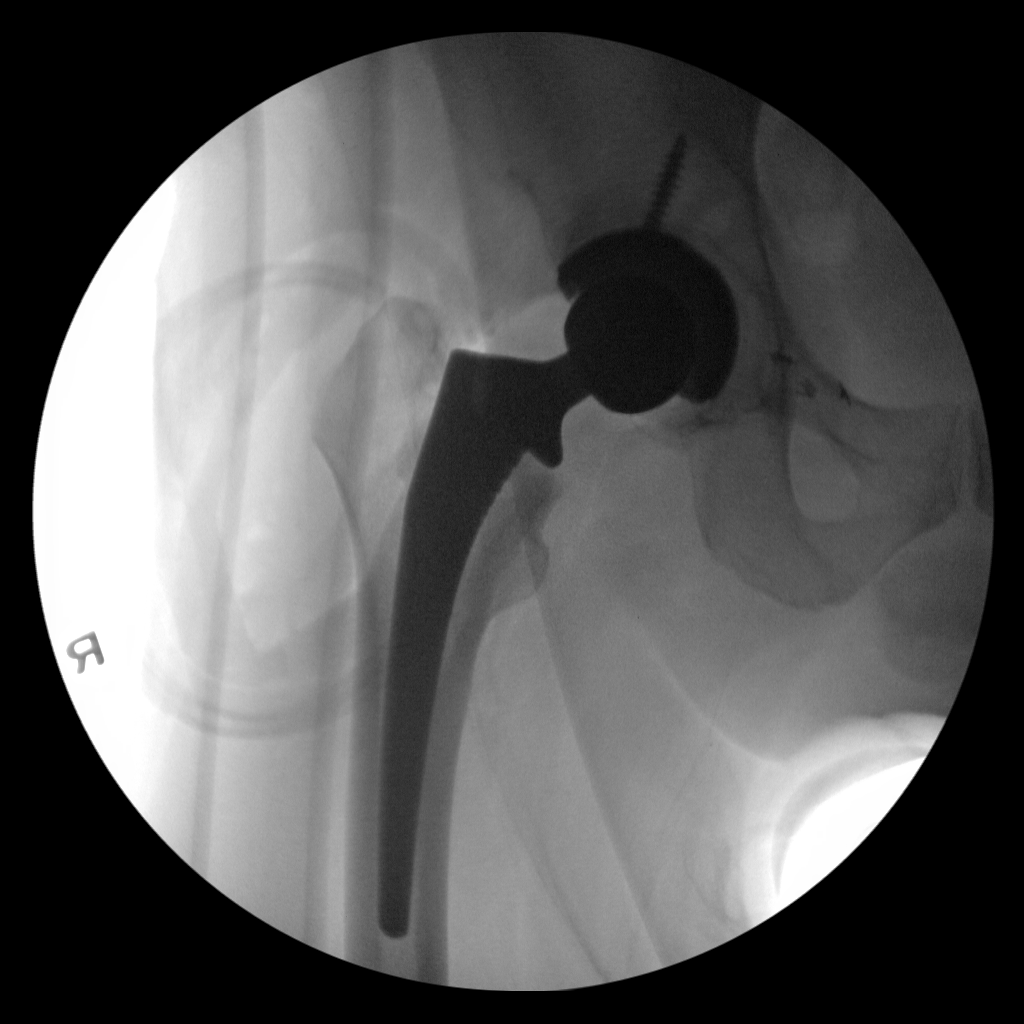
[im 2/2]
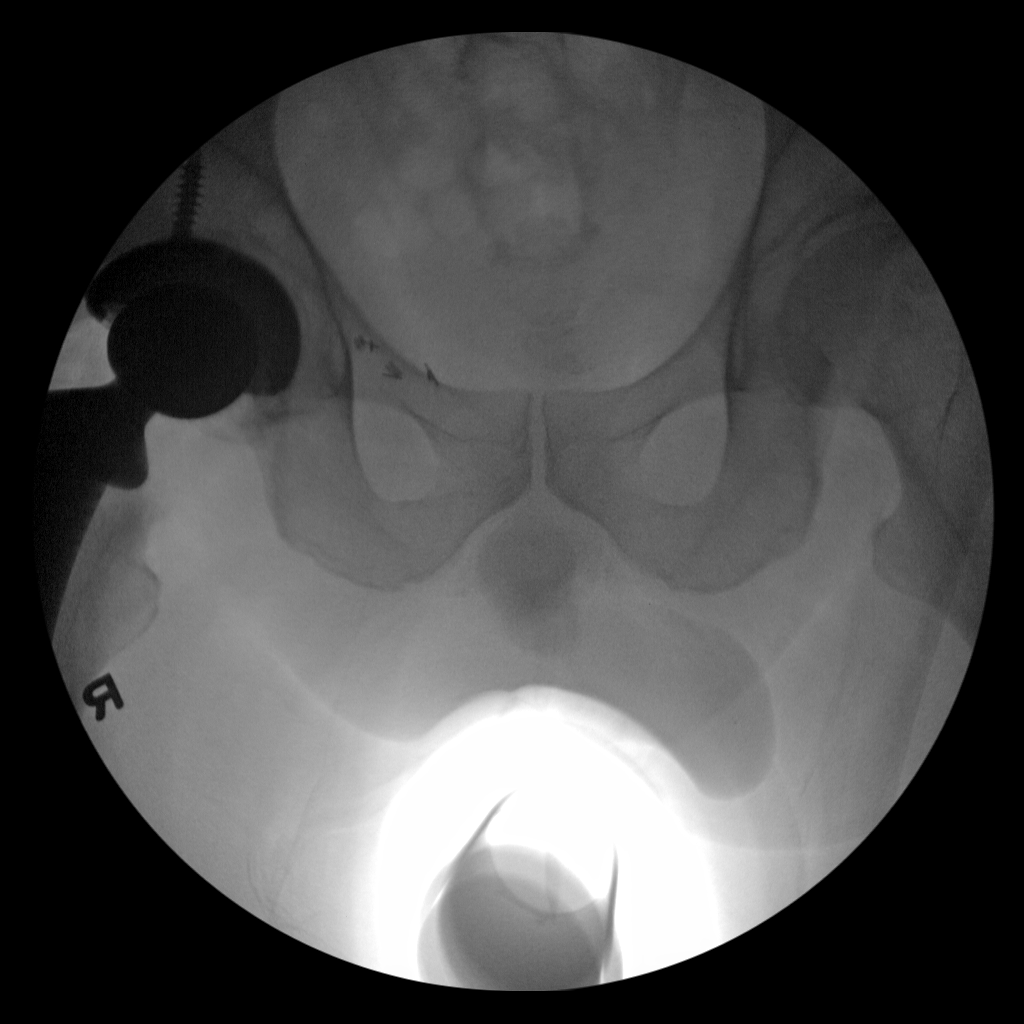

[2 of 2 positions shown; findings below may reference images not displayed]

FINDINGS: Two frontal projections dictate the lower pelvis and the right hip,
and 1 of these includes the entire prosthesis.

Screw fixation of the acetabular shell component noted. Expected
positioning and alignment, with no visible fracture or complicating
feature. Right inguinal clips incidentally noted.
IMPRESSION: 1. Right total hip prosthesis in place without complicating feature.

## 2021-04-15 NOTE — Progress Notes (Signed)
Radiation Oncology         ([phone]) [phone] ________________________________  Initial Outpatient Consultation  Name: Ronald Daniels MRN: [id number]  Date: 04/16/2021  DOB: 12-28-1955  CC:Kim, Jeneen Rinks, MD  Venetia Night, MD   REFERRING PHYSICIAN: Venetia Night, MD  DIAGNOSIS:    ICD-10-CM   1. 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 Squamous cell carcinoma, scalp/neck  C44.42     3. Squamous cell skin cancer  C44.92      SCC of scalp skin with PNI present (left central frontal scalp)  pT3 cN0 cM0  CHIEF COMPLAINT: Here to discuss management of skin cancer  HISTORY OF PRESENT ILLNESS::Ronald Daniels is a 66 y.o. male who is seen today for consideration of radiation treatment in management of SCC of the scalp with PNI. The patient presented with a 3 month history of a growth on his left scalp to Dr. Danielle Dess this past November. Left scalp biopsy performed by Dr. Danielle Dess on 02/18/21 revealed squamous cell carcinoma.   The patient followed up with Dr. Danielle Dess on 03/13/21. During which time, the left scalp lesion was noted to have grown further, and appear severe. Subsequently, Dr. Danielle Dess performed a Mohs procedure on this same date which revealed residual carcinoma with PNI present, involving at least 4 nerves of up to 0.1 mm in diameter.   Pre-op lesion size was 3x3 cm per Dr Danielle Dess. Final Primary Defect size 4.4 x 3.9cm; secondary defect size: 7.4 x 2.5cm.  Patient had sutures removed by Dr. Danielle Dess on 03/27/21, and instructed to perform DuoDerm changing every other day, and clean the Mohs site well between applications.   He is an experienced Clinical biochemist.  DR PEARCE PROVIDED THIS PHOTO (post-op):   PREVIOUS RADIATION THERAPY: No  PAST MEDICAL HISTORY:  has a past medical history of Arthritis, Headache, History of kidney stones, and Hyperlipidemia.    PAST SURGICAL HISTORY: Past Surgical History:  Procedure Laterality Date   HERNIA REPAIR     torn cartialge Left     TOTAL HIP ARTHROPLASTY Right 10/07/2016   Procedure: RIGHT TOTAL HIP ARTHROPLASTY ANTERIOR APPROACH;  Surgeon: Mcarthur Rossetti, MD;  Location: [facility];  Service: Orthopedics;  Laterality: Right;    FAMILY HISTORY: family history is not on file.  SOCIAL HISTORY:  reports that he has never smoked. He has never used smokeless tobacco. He reports current alcohol use. He reports that he does not use drugs.  ALLERGIES: No known allergies  MEDICATIONS:  Current Outpatient Medications  Medication Sig Dispense Refill   aspirin 81 MG chewable tablet Chew 1 tablet (81 mg total) by mouth 2 (two) times daily. (Patient not taking: Reported on 04/16/2021) 30 tablet 0   cyclobenzaprine (FLEXERIL) 10 MG tablet Take 1 tablet (10 mg total) by mouth 3 (three) times daily as needed. For back pain/spasms. (Patient not taking: Reported on 04/16/2021) 60 tablet 0   Dextromethorphan-Guaifenesin 10-200 MG CAPS Take 1-2 tablets by mouth 3 (three) times daily as needed (for sinus/cold or flu). (Patient not taking: Reported on 04/16/2021)     fexofenadine (ALLEGRA) 180 MG tablet Take 180 mg by mouth at bedtime. (Patient not taking: Reported on 04/16/2021)     gabapentin (NEURONTIN) 300 MG capsule Take 300 mg by mouth 2 (two) times daily. (Patient not taking: Reported on 04/16/2021)  0   HYDROcodone-acetaminophen (NORCO/VICODIN) 5-325 MG tablet Take 1-2 tablets by mouth 2 (two) times daily as needed for moderate pain. (Patient not taking:  Reported on 04/16/2021) 60 tablet 0   ibuprofen (ADVIL,MOTRIN) 200 MG tablet Take 800 mg by mouth every 8 (eight) hours as needed (for pain (back & legs)). (Patient not taking: Reported on 04/16/2021)     oxyCODONE-acetaminophen (ROXICET) 5-325 MG tablet Take 1-2 tablets by mouth every 4 (four) hours as needed. (Patient not taking: Reported on 04/16/2021) 60 tablet 0   oxymetazoline (AFRIN) 0.05 % nasal spray Place 1 spray into both nostrils 3 (three) times daily as needed for congestion.  (Patient not taking: Reported on 04/16/2021)     Probiotic Product (PROBIOTIC PO) Take 2 capsules by mouth at bedtime. (Patient not taking: Reported on 04/16/2021)     psyllium (REGULOID) 0.52 g capsule Take 2.08 g by mouth at bedtime. (Patient not taking: Reported on 04/16/2021)     simvastatin (ZOCOR) 20 MG tablet Take 20 mg by mouth at bedtime. (Patient not taking: Reported on 04/16/2021)  0   No current facility-administered medications for this encounter.    REVIEW OF SYSTEMS:  Notable for that above.   PHYSICAL EXAM:  height is 5\' 10"  (1.778 m) and weight is 206 lb (93.4 kg). His temporal temperature is 97.3 F (36.3 C) (abnormal). His blood pressure is 199/116 (abnormal) and his pulse is 72. His respiration is 18 and oxygen saturation is 99%.   General: Alert and oriented, in no acute distress  HEENT: Head is normocephalic. See photo below of scalp with incomplete healing of post op site but no sign of infection. Neck: Neck is supple, no palpable periauricular, cervical or supraclavicular lymphadenopathy. Heart: Regular in rate and rhythm with no murmurs, rubs, or gallops. Chest: Clear to auscultation bilaterally, with no rhonchi, wheezes, or rales. Neurologic:  No obvious focalities. Speech is fluent. Coordination is intact. Psychiatric: Judgment and insight are intact. Affect is appropriate.   ECOG = 0  0 - Asymptomatic (Fully active, able to carry on all predisease activities without restriction)  1 - Symptomatic but completely ambulatory (Restricted in physically strenuous activity but ambulatory and able to carry out work of a light or sedentary nature. For example, light housework, office work)  2 - Symptomatic, <50% in bed during the day (Ambulatory and capable of all self care but unable to carry out any work activities. Up and about more than 50% of waking hours)  3 - Symptomatic, >50% in bed, but not bedbound (Capable of only limited self-care, confined to bed or chair 50%  or more of waking hours)  4 - Bedbound (Completely disabled. Cannot carry on any self-care. Totally confined to bed or chair)  5 - Death   Eustace Pen MM, Creech RH, Tormey DC, et al. [phone]). "Toxicity and response criteria of the [facility]". [place] Oncol. 5 (6): 649-55   LABORATORY DATA:  Lab Results  Component Value Date   WBC 9.9 10/08/2016   HGB 14.3 10/08/2016   HCT 42.5 10/08/2016   MCV 91.8 10/08/2016   PLT 170 10/08/2016   CMP     Component Value Date/Time   NA 137 10/08/2016 0506   K 4.3 10/08/2016 0506   CL 104 10/08/2016 0506   CO2 25 10/08/2016 0506   GLUCOSE 172 (H) 10/08/2016 0506   BUN 17 10/08/2016 0506   CREATININE 1.10 10/08/2016 0506   CALCIUM 8.7 (L) 10/08/2016 0506   PROT 6.1 07/21/2008 1845   ALBUMIN 3.8 07/21/2008 1845   AST 33 07/21/2008 1845   ALT 31 07/21/2008 1845   ALKPHOS 64 07/21/2008  1845   BILITOT 0.8 07/21/2008 1845   GFRNONAA >60 10/08/2016 0506   GFRAA >60 10/08/2016 0506        RADIOGRAPHY: No results found.    IMPRESSION/PLAN: Scalp skin cancer  Today, I talked to the patient about the findings and work-up thus far.  We discussed the patient's diagnosis of scalp squamous cell carcinoma, skin, with significant PNI after Mohs, and general treatment for this, highlighting the role of radiotherapy in the management.  We discussed the available radiation techniques, and focused on the details of logistics and delivery.     We discussed the risks, benefits, and side effects of radiotherapy. Side effects may include but not necessarily be limited to: skin irritation, skin scabbing/crusting, fatigue, hair loss, extremely rare injury to skull/brain. No guarantees of treatment were given. A consent form was signed and placed in the patient's medical record. He is enthusiastic to proceed  The patient and his sister were encouraged to ask questions that I answered to the best of my ability.    Anticipate 4 weeks of  electron therapy. Will give him a couple more weeks to heal, first.  Dr. Kendrick Fries office contacted for pre-op photos to guide RT planning.    On date of service, in total, I spent 50 minutes on this encounter. Patient was seen in person.   __________________________________________   Eppie Gibson, MD  This document serves as a record of services personally performed by Eppie Gibson, MD. It was created on her behalf by Roney Mans, a trained medical scribe. The creation of this record is based on the scribe's personal observations and the provider's statements to them. This document has been checked and approved by the attending provider.

## 2021-04-15 NOTE — Progress Notes (Signed)
Histology and Location of Primary Skin Cancer   Ronald Daniels presented with the following signs/symptoms: on-going lesion to his scalp. Area continued to grow and would not heal  Past/Anticipated interventions by patient's surgeon/dermatologist for current problematic lesion, if any:  03/13/2021 --Dr. Janan Ridge Mohs surgery  Past skin cancers, if any: Patient denies  History of Blistering sunburns, if any: Yes--during his youth  SAFETY ISSUES: Prior radiation? No Pacemaker/ICD? No Possible current pregnancy? N/A Is the patient on methotrexate? No  Current Complaints / other details:  Reports significant family history of cancer

## 2021-04-16 ENCOUNTER — Ambulatory Visit
Admission: RE | Admit: 2021-04-16 | Discharge: 2021-04-16 | Disposition: A | Discharge status: Home / Self Care | Payer: 59 | Source: Ambulatory Visit | Attending: Radiation Oncology | Admitting: Radiation Oncology

## 2021-04-16 ENCOUNTER — Other Ambulatory Visit: Payer: Self-pay

## 2021-04-16 ENCOUNTER — Encounter: Payer: Self-pay | Admitting: Radiation Oncology

## 2021-04-16 VITALS — BP 199/116 | HR 72 | Temp 97.3°F | Resp 18 | Ht 70.0 in | Wt 206.0 lb

## 2021-04-16 DIAGNOSIS — C4442 Squamous cell carcinoma of skin of scalp and neck: Secondary | ICD-10-CM | POA: Insufficient documentation

## 2021-04-16 DIAGNOSIS — Z7982 Long term (current) use of aspirin: Secondary | ICD-10-CM | POA: Insufficient documentation

## 2021-04-16 DIAGNOSIS — Z79899 Other long term (current) drug therapy: Secondary | ICD-10-CM | POA: Diagnosis not present

## 2021-04-16 DIAGNOSIS — C4492 Squamous cell carcinoma of skin, unspecified: Secondary | ICD-10-CM

## 2021-04-19 ENCOUNTER — Encounter: Payer: Self-pay | Admitting: Radiation Oncology

## 2021-04-19 DIAGNOSIS — C4442 Squamous cell carcinoma of skin of scalp and neck: Secondary | ICD-10-CM | POA: Insufficient documentation

## 2021-04-23 ENCOUNTER — Encounter: Payer: Self-pay | Admitting: Radiation Oncology

## 2021-04-23 NOTE — Progress Notes (Signed)
Additional photos received from Dr. Kendrick Fries clinic:  Pre-op:   Post-op:

## 2021-05-06 ENCOUNTER — Other Ambulatory Visit: Payer: Self-pay

## 2021-05-06 ENCOUNTER — Ambulatory Visit
Admission: RE | Admit: 2021-05-06 | Discharge: 2021-05-06 | Disposition: A | Discharge status: Home / Self Care | Payer: 59 | Source: Ambulatory Visit | Attending: Radiation Oncology | Admitting: Radiation Oncology

## 2021-05-06 DIAGNOSIS — C4442 Squamous cell carcinoma of skin of scalp and neck: Secondary | ICD-10-CM | POA: Insufficient documentation

## 2021-05-10 DIAGNOSIS — C4442 Squamous cell carcinoma of skin of scalp and neck: Secondary | ICD-10-CM | POA: Diagnosis not present

## 2021-05-13 ENCOUNTER — Ambulatory Visit
Admission: RE | Admit: 2021-05-13 | Discharge: 2021-05-13 | Disposition: A | Discharge status: Home / Self Care | Payer: 59 | Source: Ambulatory Visit | Attending: Radiation Oncology | Admitting: Radiation Oncology

## 2021-05-13 ENCOUNTER — Ambulatory Visit: Payer: 59 | Admitting: Radiation Oncology

## 2021-05-13 ENCOUNTER — Other Ambulatory Visit: Payer: Self-pay

## 2021-05-13 DIAGNOSIS — C4442 Squamous cell carcinoma of skin of scalp and neck: Secondary | ICD-10-CM | POA: Diagnosis not present

## 2021-05-13 NOTE — Progress Notes (Signed)
Pt here for patient teaching. Pt given Radiation and You booklet, Managing Acute Radiation Side Effects for Head and Neck Cancer handout, skin care instructions, and Sonafine.  Reviewed areas of pertinence such as fatigue, hair loss, mouth changes, nausea and vomiting, skin changes, throat changes, headache, earaches, and taste changes. Pt able to give teach back of to pat skin, use unscented/gentle soap, and drink plenty of water, apply Sonafine bid, avoid applying anything to skin within 4 hours of treatment, and to use an electric razor if they must shave. Pt verbalizes understanding of information given and will contact nursing with any questions or concerns.     Http://rtanswers.org/treatmentinformation/whattoexpect/index      

## 2021-05-14 ENCOUNTER — Other Ambulatory Visit: Payer: Self-pay

## 2021-05-14 ENCOUNTER — Ambulatory Visit
Admission: RE | Admit: 2021-05-14 | Discharge: 2021-05-14 | Disposition: A | Discharge status: Home / Self Care | Payer: 59 | Source: Ambulatory Visit | Attending: Radiation Oncology | Admitting: Radiation Oncology

## 2021-05-14 DIAGNOSIS — C4442 Squamous cell carcinoma of skin of scalp and neck: Secondary | ICD-10-CM | POA: Diagnosis not present

## 2021-05-15 ENCOUNTER — Ambulatory Visit
Admission: RE | Admit: 2021-05-15 | Discharge: 2021-05-15 | Disposition: A | Discharge status: Home / Self Care | Payer: 59 | Source: Ambulatory Visit | Attending: Radiation Oncology | Admitting: Radiation Oncology

## 2021-05-15 DIAGNOSIS — C4442 Squamous cell carcinoma of skin of scalp and neck: Secondary | ICD-10-CM | POA: Diagnosis not present

## 2021-05-16 ENCOUNTER — Ambulatory Visit
Admission: RE | Admit: 2021-05-16 | Discharge: 2021-05-16 | Disposition: A | Discharge status: Home / Self Care | Payer: 59 | Source: Ambulatory Visit | Attending: Radiation Oncology | Admitting: Radiation Oncology

## 2021-05-16 DIAGNOSIS — C4442 Squamous cell carcinoma of skin of scalp and neck: Secondary | ICD-10-CM | POA: Diagnosis not present

## 2021-05-17 ENCOUNTER — Other Ambulatory Visit: Payer: Self-pay

## 2021-05-17 ENCOUNTER — Ambulatory Visit
Admission: RE | Admit: 2021-05-17 | Discharge: 2021-05-17 | Disposition: A | Discharge status: Home / Self Care | Payer: 59 | Source: Ambulatory Visit | Attending: Radiation Oncology | Admitting: Radiation Oncology

## 2021-05-17 DIAGNOSIS — C4442 Squamous cell carcinoma of skin of scalp and neck: Secondary | ICD-10-CM | POA: Diagnosis not present

## 2021-05-20 ENCOUNTER — Ambulatory Visit
Admission: RE | Admit: 2021-05-20 | Discharge: 2021-05-20 | Disposition: A | Discharge status: Home / Self Care | Payer: 59 | Source: Ambulatory Visit | Attending: Radiation Oncology | Admitting: Radiation Oncology

## 2021-05-20 ENCOUNTER — Other Ambulatory Visit: Payer: Self-pay

## 2021-05-20 DIAGNOSIS — C4442 Squamous cell carcinoma of skin of scalp and neck: Secondary | ICD-10-CM | POA: Diagnosis not present

## 2021-05-21 ENCOUNTER — Other Ambulatory Visit: Payer: Self-pay

## 2021-05-21 ENCOUNTER — Ambulatory Visit
Admission: RE | Admit: 2021-05-21 | Discharge: 2021-05-21 | Disposition: A | Discharge status: Home / Self Care | Payer: 59 | Source: Ambulatory Visit | Attending: Radiation Oncology | Admitting: Radiation Oncology

## 2021-05-21 DIAGNOSIS — C4442 Squamous cell carcinoma of skin of scalp and neck: Secondary | ICD-10-CM | POA: Diagnosis not present

## 2021-05-22 ENCOUNTER — Ambulatory Visit
Admission: RE | Admit: 2021-05-22 | Discharge: 2021-05-22 | Disposition: A | Discharge status: Home / Self Care | Payer: 59 | Source: Ambulatory Visit | Attending: Radiation Oncology | Admitting: Radiation Oncology

## 2021-05-22 DIAGNOSIS — C4442 Squamous cell carcinoma of skin of scalp and neck: Secondary | ICD-10-CM | POA: Diagnosis not present

## 2021-05-23 ENCOUNTER — Ambulatory Visit
Admission: RE | Admit: 2021-05-23 | Discharge: 2021-05-23 | Disposition: A | Discharge status: Home / Self Care | Payer: 59 | Source: Ambulatory Visit | Attending: Radiation Oncology | Admitting: Radiation Oncology

## 2021-05-23 DIAGNOSIS — C4442 Squamous cell carcinoma of skin of scalp and neck: Secondary | ICD-10-CM | POA: Diagnosis not present

## 2021-05-24 ENCOUNTER — Ambulatory Visit
Admission: RE | Admit: 2021-05-24 | Discharge: 2021-05-24 | Disposition: A | Discharge status: Home / Self Care | Payer: 59 | Source: Ambulatory Visit | Attending: Radiation Oncology | Admitting: Radiation Oncology

## 2021-05-24 ENCOUNTER — Other Ambulatory Visit: Payer: Self-pay

## 2021-05-24 DIAGNOSIS — C4442 Squamous cell carcinoma of skin of scalp and neck: Secondary | ICD-10-CM | POA: Diagnosis not present

## 2021-05-27 ENCOUNTER — Other Ambulatory Visit: Payer: Self-pay

## 2021-05-27 ENCOUNTER — Ambulatory Visit
Admission: RE | Admit: 2021-05-27 | Discharge: 2021-05-27 | Disposition: A | Discharge status: Home / Self Care | Payer: 59 | Source: Ambulatory Visit | Attending: Radiation Oncology | Admitting: Radiation Oncology

## 2021-05-27 DIAGNOSIS — C4442 Squamous cell carcinoma of skin of scalp and neck: Secondary | ICD-10-CM | POA: Diagnosis not present

## 2021-05-28 ENCOUNTER — Ambulatory Visit
Admission: RE | Admit: 2021-05-28 | Discharge: 2021-05-28 | Disposition: A | Discharge status: Home / Self Care | Payer: 59 | Source: Ambulatory Visit | Attending: Radiation Oncology | Admitting: Radiation Oncology

## 2021-05-28 DIAGNOSIS — C4442 Squamous cell carcinoma of skin of scalp and neck: Secondary | ICD-10-CM | POA: Diagnosis not present

## 2021-05-29 ENCOUNTER — Ambulatory Visit
Admission: RE | Admit: 2021-05-29 | Discharge: 2021-05-29 | Disposition: A | Discharge status: Home / Self Care | Payer: 59 | Source: Ambulatory Visit | Attending: Radiation Oncology | Admitting: Radiation Oncology

## 2021-05-29 DIAGNOSIS — C4442 Squamous cell carcinoma of skin of scalp and neck: Secondary | ICD-10-CM | POA: Diagnosis not present

## 2021-05-30 ENCOUNTER — Ambulatory Visit
Admission: RE | Admit: 2021-05-30 | Discharge: 2021-05-30 | Disposition: A | Discharge status: Home / Self Care | Payer: 59 | Source: Ambulatory Visit | Attending: Radiation Oncology | Admitting: Radiation Oncology

## 2021-05-30 ENCOUNTER — Other Ambulatory Visit: Payer: Self-pay

## 2021-05-30 DIAGNOSIS — C4442 Squamous cell carcinoma of skin of scalp and neck: Secondary | ICD-10-CM | POA: Diagnosis not present

## 2021-05-31 ENCOUNTER — Ambulatory Visit
Admission: RE | Admit: 2021-05-31 | Discharge: 2021-05-31 | Disposition: A | Discharge status: Home / Self Care | Payer: 59 | Source: Ambulatory Visit | Attending: Radiation Oncology | Admitting: Radiation Oncology

## 2021-05-31 DIAGNOSIS — C4442 Squamous cell carcinoma of skin of scalp and neck: Secondary | ICD-10-CM | POA: Diagnosis not present

## 2021-06-03 ENCOUNTER — Other Ambulatory Visit: Payer: Self-pay

## 2021-06-03 ENCOUNTER — Ambulatory Visit
Admission: RE | Admit: 2021-06-03 | Discharge: 2021-06-03 | Disposition: A | Discharge status: Home / Self Care | Payer: 59 | Source: Ambulatory Visit | Attending: Radiation Oncology | Admitting: Radiation Oncology

## 2021-06-03 DIAGNOSIS — C4442 Squamous cell carcinoma of skin of scalp and neck: Secondary | ICD-10-CM

## 2021-06-03 MED ORDER — SONAFINE EX EMUL
1.0000 "application " | Freq: Two times a day (BID) | CUTANEOUS | Status: DC
  Administered 2021-06-03: 1 via TOPICAL

## 2021-06-04 ENCOUNTER — Ambulatory Visit
Admission: RE | Admit: 2021-06-04 | Discharge: 2021-06-04 | Disposition: A | Discharge status: Home / Self Care | Payer: 59 | Source: Ambulatory Visit | Attending: Radiation Oncology | Admitting: Radiation Oncology

## 2021-06-04 DIAGNOSIS — C4442 Squamous cell carcinoma of skin of scalp and neck: Secondary | ICD-10-CM | POA: Diagnosis not present

## 2021-06-05 ENCOUNTER — Ambulatory Visit
Admission: RE | Admit: 2021-06-05 | Discharge: 2021-06-05 | Disposition: A | Discharge status: Home / Self Care | Payer: 59 | Source: Ambulatory Visit | Attending: Radiation Oncology | Admitting: Radiation Oncology

## 2021-06-05 ENCOUNTER — Other Ambulatory Visit: Payer: Self-pay

## 2021-06-05 DIAGNOSIS — C4442 Squamous cell carcinoma of skin of scalp and neck: Secondary | ICD-10-CM | POA: Diagnosis not present

## 2021-06-06 ENCOUNTER — Ambulatory Visit
Admission: RE | Admit: 2021-06-06 | Discharge: 2021-06-06 | Disposition: A | Discharge status: Home / Self Care | Payer: 59 | Source: Ambulatory Visit | Attending: Radiation Oncology | Admitting: Radiation Oncology

## 2021-06-06 DIAGNOSIS — C4442 Squamous cell carcinoma of skin of scalp and neck: Secondary | ICD-10-CM | POA: Diagnosis not present

## 2021-06-07 ENCOUNTER — Ambulatory Visit
Admission: RE | Admit: 2021-06-07 | Discharge: 2021-06-07 | Disposition: A | Discharge status: Home / Self Care | Payer: 59 | Source: Ambulatory Visit | Attending: Radiation Oncology | Admitting: Radiation Oncology

## 2021-06-07 ENCOUNTER — Encounter: Payer: Self-pay | Admitting: Radiation Oncology

## 2021-06-07 ENCOUNTER — Other Ambulatory Visit: Payer: Self-pay

## 2021-06-07 DIAGNOSIS — C4442 Squamous cell carcinoma of skin of scalp and neck: Secondary | ICD-10-CM | POA: Diagnosis not present

## 2021-06-07 NOTE — Progress Notes (Signed)
Oncology Nurse Navigator Documentation  ? ?Mr. Seher completed radiation to his left scalp today. He will see Dr. Isidore Moos for follow up on 07/19/21.  ?He has my contact information to call if he has any needs or questions before that date. ? ? ?Harlow Asa RN, BSN, OCN ?Head & Neck Oncology Nurse Navigator ?[facility] ?Phone # [phone]  ?Fax # [phone]   ?

## 2021-07-03 NOTE — Progress Notes (Signed)
? ?                                                                                                                                                          ?  Patient Name: Ronald Daniels ?MRN: [id number] ?DOB: May 06, 1955 ?Referring Physician: Janan Ridge (Profile Not Attached) ?Date of Service: 06/07/2021 ?[facility], [place] ? ?                                                      End Of Treatment Note ? ?Diagnoses: C44.42-Squamous cell carcinoma of skin of scalp and neck ? ?Cancer Staging:  Cancer Staging  ?No matching staging information was found for the patient. ?pT3 cN0 cM0 ? ?Intent: Curative ? ?Radiation Treatment Dates: 05/13/2021 through 06/07/2021 ?Site Technique Total Dose (Gy) Dose per Fx (Gy) Completed Fx Beam Energies  ?Scalp: HN_L_scalp Complex 50/50 2.5 20/20 6E  ? ?Narrative: The patient tolerated radiation therapy relatively well.  ? ?Plan: The patient will follow-up with radiation oncology in 9mo. ? ? ?----------------------------------- ? ?SEppie Gibson MD ? ?

## 2021-07-19 ENCOUNTER — Ambulatory Visit: Payer: Self-pay | Admitting: Radiation Oncology
